# Patient Record
Sex: Male | Born: 1940 | Race: White | Hispanic: No | Marital: Single | Smoking: Former smoker
Health system: Southern US, Community
[De-identification: ages and names within clinical notes are randomized; demographics above are authoritative.]

## PROBLEM LIST (undated history)

## (undated) DIAGNOSIS — J449 Chronic obstructive pulmonary disease, unspecified: Secondary | ICD-10-CM

## (undated) DIAGNOSIS — I10 Essential (primary) hypertension: Secondary | ICD-10-CM

## (undated) DIAGNOSIS — M1990 Unspecified osteoarthritis, unspecified site: Secondary | ICD-10-CM

## (undated) DIAGNOSIS — I2510 Atherosclerotic heart disease of native coronary artery without angina pectoris: Secondary | ICD-10-CM

## (undated) DIAGNOSIS — E785 Hyperlipidemia, unspecified: Secondary | ICD-10-CM

## (undated) DIAGNOSIS — Z9989 Dependence on other enabling machines and devices: Secondary | ICD-10-CM

## (undated) DIAGNOSIS — G4733 Obstructive sleep apnea (adult) (pediatric): Secondary | ICD-10-CM

## (undated) HISTORY — DX: Chronic obstructive pulmonary disease, unspecified: J44.9

## (undated) HISTORY — DX: Obstructive sleep apnea (adult) (pediatric): G47.33

## (undated) HISTORY — DX: Essential (primary) hypertension: I10

## (undated) HISTORY — DX: Dependence on other enabling machines and devices: Z99.89

## (undated) HISTORY — DX: Atherosclerotic heart disease of native coronary artery without angina pectoris: I25.10

## (undated) HISTORY — DX: Hyperlipidemia, unspecified: E78.5

## (undated) HISTORY — DX: Unspecified osteoarthritis, unspecified site: M19.90

## (undated) HISTORY — PX: CHOLECYSTECTOMY: SHX55

---

## 1986-01-28 HISTORY — PX: CARDIAC CATHETERIZATION: SHX172

## 1994-01-28 HISTORY — PX: ANGIOPLASTY: SHX39

## 2004-10-31 ENCOUNTER — Ambulatory Visit: Payer: Self-pay | Admitting: Internal Medicine

## 2004-11-26 ENCOUNTER — Ambulatory Visit: Payer: Self-pay | Admitting: Internal Medicine

## 2004-12-04 ENCOUNTER — Encounter (INDEPENDENT_AMBULATORY_CARE_PROVIDER_SITE_OTHER): Payer: Self-pay | Admitting: *Deleted

## 2004-12-04 ENCOUNTER — Ambulatory Visit: Payer: Self-pay | Admitting: Internal Medicine

## 2004-12-27 ENCOUNTER — Encounter: Admission: RE | Admit: 2004-12-27 | Discharge: 2004-12-27 | Payer: Self-pay | Admitting: Orthopedic Surgery

## 2005-02-07 ENCOUNTER — Ambulatory Visit (HOSPITAL_COMMUNITY): Admission: RE | Admit: 2005-02-07 | Discharge: 2005-02-08 | Payer: Self-pay | Admitting: Orthopedic Surgery

## 2006-02-04 ENCOUNTER — Emergency Department (HOSPITAL_COMMUNITY): Admission: EM | Admit: 2006-02-04 | Discharge: 2006-02-05 | Payer: Self-pay | Admitting: Emergency Medicine

## 2007-09-14 ENCOUNTER — Ambulatory Visit (HOSPITAL_COMMUNITY): Admission: RE | Admit: 2007-09-14 | Discharge: 2007-09-14 | Payer: Self-pay | Admitting: Orthopedic Surgery

## 2007-12-09 ENCOUNTER — Ambulatory Visit: Payer: Self-pay | Admitting: Internal Medicine

## 2007-12-09 DIAGNOSIS — Z860100 Personal history of colon polyps, unspecified: Secondary | ICD-10-CM | POA: Insufficient documentation

## 2007-12-09 DIAGNOSIS — K219 Gastro-esophageal reflux disease without esophagitis: Secondary | ICD-10-CM | POA: Insufficient documentation

## 2007-12-09 DIAGNOSIS — R7309 Other abnormal glucose: Secondary | ICD-10-CM

## 2007-12-22 ENCOUNTER — Encounter: Payer: Self-pay | Admitting: Internal Medicine

## 2007-12-22 ENCOUNTER — Ambulatory Visit: Payer: Self-pay | Admitting: Internal Medicine

## 2008-04-05 ENCOUNTER — Ambulatory Visit (HOSPITAL_COMMUNITY): Admission: RE | Admit: 2008-04-05 | Discharge: 2008-04-05 | Payer: Self-pay | Admitting: Cardiovascular Disease

## 2009-04-20 ENCOUNTER — Telehealth (INDEPENDENT_AMBULATORY_CARE_PROVIDER_SITE_OTHER): Payer: Self-pay | Admitting: *Deleted

## 2009-12-26 ENCOUNTER — Encounter (INDEPENDENT_AMBULATORY_CARE_PROVIDER_SITE_OTHER): Payer: Self-pay | Admitting: *Deleted

## 2010-01-08 ENCOUNTER — Encounter (INDEPENDENT_AMBULATORY_CARE_PROVIDER_SITE_OTHER): Payer: Self-pay | Admitting: *Deleted

## 2010-01-09 ENCOUNTER — Telehealth: Payer: Self-pay | Admitting: Internal Medicine

## 2010-02-05 ENCOUNTER — Encounter (INDEPENDENT_AMBULATORY_CARE_PROVIDER_SITE_OTHER): Payer: Self-pay | Admitting: *Deleted

## 2010-02-06 ENCOUNTER — Ambulatory Visit
Admission: RE | Admit: 2010-02-06 | Discharge: 2010-02-06 | Payer: Self-pay | Source: Home / Self Care | Attending: Internal Medicine | Admitting: Internal Medicine

## 2010-02-27 ENCOUNTER — Ambulatory Visit
Admission: RE | Admit: 2010-02-27 | Discharge: 2010-02-27 | Payer: Self-pay | Source: Home / Self Care | Attending: Internal Medicine | Admitting: Internal Medicine

## 2010-02-27 ENCOUNTER — Encounter: Payer: Self-pay | Admitting: Internal Medicine

## 2010-02-27 LAB — GLUCOSE, CAPILLARY: Glucose-Capillary: 99 mg/dL (ref 70–99)

## 2010-02-27 NOTE — Letter (Signed)
Summary: Colonoscopy Letter  [facility]  [address] [place], [place] [address]   Phone: [phone]  Fax: [phone]      December 26, 2009 MRN: [id number]   Charles Mayer [address] [place], [place]  [address]   Dear Mr. Acri,   According to your medical record, it is time for you to schedule a Colonoscopy. The American Cancer Society recommends this procedure as a method to detect early colon cancer. Patients with a family history of colon cancer, or a personal history of colon polyps or inflammatory bowel disease are at increased risk.  This letter has been generated based on the recommendations made at the time of your procedure. If you feel that in your particular situation this may no longer apply, please contact our office.  Please call our office at [phone] to schedule this appointment or to update your records at your earliest convenience.  Thank you for cooperating with [place] to provide you with the very best care possible.   Sincerely,   Iva Boop, M.D.  [facility]astro[facility]ology [facility] [phone]

## 2010-02-27 NOTE — Progress Notes (Signed)
  Phone Note Other Incoming   Request: Send information Summary of Call: Request received from [employer] forwarded to [employer].

## 2010-03-01 NOTE — Miscellaneous (Signed)
Summary: [facility] PV  Clinical Lists Changes  Medications: Added new medication of MOVIPREP 100 GM  SOLR (PEG-KCL-NACL-NASULF-NA ASC-C) As per prep instructions. - Signed Rx of MOVIPREP 100 GM  SOLR (PEG-KCL-NACL-NASULF-NA ASC-C) As per prep instructions.;  #1 x 0;  Signed;  Entered by: Doristine Church RN II;  Authorized by: Iva Boop MD, [facility];  Method [facility]: [facility]  [phone]*, [address], [place], [place]  [address], Ph: [phone] or [phone], Fax: [phone] Allergies: Changed allergy or adverse reaction from ADHESIVE TAPE to ADHESIVE TAPE Observations: Added new observation of ALLERGY REV: Done (02/06/2010 8:09)    Prescriptions: MOVIPREP 100 GM  SOLR (PEG-KCL-NACL-NASULF-NA ASC-C) As per prep instructions.  #1 x 0   Entered by:   Doristine Church RN II   Authorized by:   Iva Boop MD, [facility]   Signed by:   Doristine Church RN II on 02/06/2010   Method used:   [facility]ronically to        [facility]  [phone]* (retail)       [address] [place], [place]  [address]       Ph: [phone] or [phone]       Fax: [phone]   RxID:   [id number]

## 2010-03-01 NOTE — Letter (Signed)
Summary: Moviprep Instructions  [facility]  [address]   [place], [place] [address]   Phone: [phone]  Fax: [phone]       Charles Mayer    15-Dec-1940    MRN: [id number]        Procedure Day Dorna Bloom: Tuesday, 02-27-10     Arrival Time: 7:30 a.m.      Procedure Time: 8:30 a.m.     Location of Procedure:                    x   [facility] (4th Floor)   PREPARATION FOR COLONOSCOPY WITH MOVIPREP   Starting 5 days prior to your procedure 02-22-10 do not eat nuts, seeds, popcorn, corn, beans, peas,  salads, or any raw vegetables.  Do not take any fiber supplements (e.g. Metamucil, Citrucel, and Benefiber).  THE DAY BEFORE YOUR PROCEDURE         DATE: 02-26-10  DAY: Monday  1.  Drink clear liquids the entire day-NO SOLID FOOD  2.  Do not drink anything colored red or purple.  Avoid juices with pulp.  No orange juice.  3.  Drink at least 64 oz. (8 glasses) of fluid/clear liquids during the day to prevent dehydration and help the prep work efficiently.  CLEAR LIQUIDS INCLUDE: Water Jello Ice Popsicles Tea (sugar ok, no milk/cream) Powdered fruit flavored drinks Coffee (sugar ok, no milk/cream) Gatorade Juice: apple, white grape, white cranberry  Lemonade Clear bullion, consomm, broth Carbonated beverages (any kind) Strained chicken noodle soup Hard Candy                             4.  In the morning, mix first dose of MoviPrep solution:    Empty 1 Pouch A and 1 Pouch B into the disposable container    Add lukewarm drinking water to the top line of the container. Mix to dissolve    Refrigerate (mixed solution should be used within 24 hrs)  5.  Begin drinking the prep at 5:00 p.m. The MoviPrep container is divided by 4 marks.   Every 15 minutes drink the solution down to the next mark (approximately 8 oz) until the full liter is complete.   6.  Follow completed prep with 16 oz of clear liquid of your choice (Nothing red or  purple).  Continue to drink clear liquids until bedtime.  7.  Before going to bed, mix second dose of MoviPrep solution:    Empty 1 Pouch A and 1 Pouch B into the disposable container    Add lukewarm drinking water to the top line of the container. Mix to dissolve    Refrigerate  THE DAY OF YOUR PROCEDURE      DATE: 02-27-10  DAY: Tuesday  Beginning at 3:30 a.m. (5 hours before procedure):         1. Every 15 minutes, drink the solution down to the next mark (approx 8 oz) until the full liter is complete.  2. Follow completed prep with 16 oz. of clear liquid of your choice.    3. You may drink clear liquids until 6:30 a.m. (2 HOURS BEFORE PROCEDURE).   MEDICATION INSTRUCTIONS      Additional medication instructions: _Do not take med with HCTZ the am of procedure         OTHER INSTRUCTIONS  You will need a responsible adult at least 69 years of age to accompany you  and drive you home.   This person must remain in the waiting room during your procedure.  Wear loose fitting clothing that is easily removed.  Leave jewelry and other valuables at home.  However, you may wish to bring a book to read or  an iPod/MP3 player to listen to music as you wait for your procedure to start.  Remove all body piercing jewelry and leave at home.  Total time from sign-in until discharge is approximately 2-3 hours.  You should go home directly after your procedure and rest.  You can resume normal activities the  day after your procedure.  The day of your procedure you should not:   Drive   Make legal decisions   Operate machinery   Drink alcohol   Return to work  You will receive specific instructions about eating, activities and medications before you leave.    The above instructions have been reviewed and explained to me by   Doristine Church RN II  February 06, 2010 8:46 AM    I fully understand and can verbalize these instructions _____________________________ Date  _________

## 2010-03-01 NOTE — Letter (Signed)
Summary: Pre Visit Letter Revised  [facility]  [address] [place], [place] [address]   Phone: [phone]  Fax: [phone]        01/08/2010 MRN: [id number] Charles Mayer [address] [place], [place]  [address]             Procedure Date:  02/27/2010  Welcome to the Gastroenterology Division at [facility].    You are scheduled to see a nurse for your pre-procedure visit on 02/06/2010 at 8:00AM on the 3rd floor at [facility], [address]  We ask that you try to arrive at our office 15 minutes prior to your appointment time to allow for check-in.  Please take a minute to review the attached form.  If you answer "Yes" to one or more of the questions on the first page, we ask that you call the person listed at your earliest opportunity.  If you answer "No" to all of the questions, please complete the rest of the form and bring it to your appointment.    Your nurse visit will consist of discussing your medical and surgical history, your immediate family medical history, and your medications.   If you are unable to list all of your medications on the form, please bring the medication bottles to your appointment and we will list them.  We will need to be aware of both prescribed and over the counter drugs.  We will need to know exact dosage information as well.    Please be prepared to read and sign documents such as consent forms, a financial agreement, and acknowledgement forms.  If necessary, and with your consent, a friend or relative is welcome to sit-in on the nurse visit with you.  Please bring your insurance card so that we may make a copy of it.  If your insurance requires a referral to see a specialist, please bring your referral form from your primary care physician.  No co-pay is required for this nurse visit.     If you cannot keep your appointment, please call [phone] to cancel or reschedule prior to your appointment date.  This  allows [place] the opportunity to schedule an appointment for another patient in need of care.    Thank you for choosing [facility] for your medical needs.  We appreciate the opportunity to care for you.  Please visit [place] at our website  to learn more about our practice.  Sincerely, The Gastroenterology Division

## 2010-03-01 NOTE — Progress Notes (Signed)
Summary: previsit letter ?'s  Phone Note Call from Patient Call back at Home Phone [phone]   Caller: Patient Call For: Dr. Leone Payor Reason for Call: Talk to Nurse Summary of Call: previsit letter ?'s Initial call taken by: Vallarie Mare,  January 09, 2010 2:25 PM  Follow-up for Phone Call        pt is diet controlled diabetic, he is not on blood thinners.  Pt will bring med bottles with him to previsit. Follow-up by: Francee Piccolo CMA Duncan Dull),  January 09, 2010 4:55 PM

## 2010-03-07 NOTE — Procedures (Signed)
Summary: Colonoscopy  Patient: [name] Note: All result statuses are Final unless otherwise noted.  Tests: (1) Colonoscopy (COL)   COL Colonoscopy           DONE     [facility]     [address]     [place], [place]  [address]           COLONOSCOPY PROCEDURE REPORT           PATIENT:  Charles Mayer, Charles Mayer  MR#:  [id number]     BIRTHDATE:  05/27/1940, 69 yrs. old  GENDER:  male     ENDOSCOPIST:  Iva Boop, MD, [facility]           PROCEDURE DATE:  02/27/2010     PROCEDURE:  Higher-risk screening colonoscopy G0105           ASA CLASS:  Class II     INDICATIONS:  surveillance and high-risk screening, history of     pre-cancerous (adenomatous) colon polyps 1 mm adenoma 2006, no     adenomas 2003, reported adenomatous polyp(s) earlier than that     MEDICATIONS:   Fentanyl 50 mcg IV, Versed 7 mg IV           DESCRIPTION OF PROCEDURE:   After the risks benefits and     alternatives of the procedure were thoroughly explained, informed     consent was obtained.  Digital rectal exam was performed and     revealed no abnormalities and normal prostate.   The LB CF-[id number]     [id number] endoscope was introduced through the anus and advanced to     the cecum, which was identified by both the appendix and ileocecal     valve, without limitations.  The quality of the prep was     excellent, using MoviPrep.  The instrument was then slowly     withdrawn as the colon was fully examined. Insertion: 2:20 minutes     Withdrawal: 11:47 minutes     <<PROCEDUREIMAGES>>           FINDINGS:  Moderate diverticulosis was found in the sigmoid colon.     This was otherwise a normal examination of the colon.     Retroflexed views in the rectum revealed no abnormalities.    The     scope was then withdrawn from the patient and the procedure     completed.           COMPLICATIONS:  None     ENDOSCOPIC IMPRESSION:     1) Moderate diverticulosis in the sigmoid colon     2) Otherwise normal  examination with excellent prep     3) Personal history adenomatous polyps (1mm adenoma 2006, others     years before 2003)           REPEAT EXAM:  In 7 year(s) for routine screening colonoscopy.           Iva Boop, MD, [facility]           CC:  Holley Bouche, MD     The Patient           n.     eSIGNED:   Iva Boop at 02/27/2010 09:04 AM           Salome Spotted, [id number]  Note: An exclamation mark (!) indicates a result that was not dispersed into the flowsheet. Document Creation Date: 02/27/2010 9:04 AM _______________________________________________________________________  (1) Order result status:  Final Collection or observation date-time: 02/27/2010 08:53 Requested date-time:  Receipt date-time:  Reported date-time:  Referring Physician:   Ordering Physician: Stan Head [phone]) Specimen Source:  Source: Launa Grill Order Number: [id number] Lab site:   Appended Document: Colonoscopy    Clinical Lists Changes  Observations: Added new observation of COLONNXTDUE: 01/2017 (02/27/2010 10:49)

## 2010-06-12 NOTE — Op Note (Signed)
NAME:  Charles Mayer, Charles Mayer NO.:  [id number]   MEDICAL RECORD NO.:  [id number]          PATIENT TYPE:  AMB   LOCATION:  DAY                          FACILITY:  [facility]   PHYSICIAN:  Marlowe Kays, M.D.  DATE OF BIRTH:  08-01-1940   DATE OF PROCEDURE:  09/14/2007  DATE OF DISCHARGE:                               OPERATIVE REPORT   PREOPERATIVE DIAGNOSES:  1. Torn medial meniscus.  2. Medial shelf plica, right knee.   POSTOPERATIVE DIAGNOSES:  1. Torn medial and lateral menisci.  2. Medial shelf and lateral sharp plicae.  3. Osteoarthritis, right knee   OPERATION:  Right knee arthroscopy with:  1. Partial medial and lateral meniscectomy.  2. Shaving of medial and lateral femoral condyles.  3. Shaving of patella.  4. Excision medial and lateral shelf plicae.   SURGEON:  Dr. Simonne Come.   ASSISTANT:  Nurse   ANESTHESIA:  General.   PATHOLOGY/JUSTIFICATION FOR PROCEDURE:  Painful right knee with the  diagnoses noted under the MRI.  He had additional pathology as discussed  below.   PROCEDURE:  He was cleared from his cardiologist for surgery.  Satisfactory general anesthesia, Ace wrap and knee support to left lower  extremity, right leg was Esmarch out nonsterilely and tourniquet  inflated to 350 mmHg thigh, stabilizer applied, right leg was prepped  with DuraPrep from stabilizer to ankle and draped in sterile field,  superior and medial saline inflow.  First, through an anterolateral  portal, medial compartment of the knee joint was evaluated.  In the  anterior third of the joint, there was a large amount of reactive  synovium, and after clearing this out with a 3.5 shaver, he had a  partial tear of the medial meniscus which I resected with the shaver.  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  He had a fairly extensive  tear on the undersurface of the posterior third of the medial  meniscus  which was not visible from the superior surface but was quite palpable  on probing.  I then resected the posterior third of the medial meniscus  back to a stable rim with a combination of small angle upbite baskets  and the 3.55 shaver.  Looking at the medial gutter and suprapatellar  area, he had good bit of wear of the patella which I shaved partially  with 3.5 shaver.  He also had a fairly prominent medial shelf plica with  also some prominent retinacular bands and the same laterally.  Part of  these bands and plica, I was able to section with scissors but had to  complete the resection with instruments in the lateral portal.  I then  reversed portals.  The ACL was intact.  He had a good bit of synovitis  laterally with grade 2-3/4 chondromalacia of the lateral femoral condyle  as well.  After cleaning this up with a 3.5 shaver, I was able to get a  good look the lateral meniscus with a small tear of the posterior curve  which I resected back to stable rim  with the shaver.  Pictures were  taken.  I then looked up the lateral gutter and suprapatellar area and  partially completed the sectioning of the plicae particularly medially  and did additional shaving of the patella.  I then reversed portals once  again and completed resection of the plicae and shaving of patella.  I  then irrigated the knee joint until clear and all fluid possible  removed.  I closed the 2 anterior portals with 4-0 nylon and injected 20  mL of half percent Marcaine with adrenaline through the inflow apparatus  which was removed, and this portal closed as well with 4-0 nylon.  Betadine, Adaptic, dry sterile pressure dressing were applied.  Tourniquet was released.  He tolerated the procedure well and was taken  to recovery in satisfactory with no known complications.           ______________________________  Marlowe Kays, M.D.     JA/MEDQ  D:  09/14/2007  T:  09/14/2007  Job:  [id number]

## 2010-06-15 NOTE — Op Note (Signed)
NAME:  Charles Mayer, Charles Mayer NO.:  [id number]   MEDICAL RECORD NO.:  [id number]          PATIENT TYPE:  AMB   LOCATION:  DAY                          FACILITY:  [facility]   PHYSICIAN:  Marlowe Kays, M.D.  DATE OF BIRTH:  05/14/1940   DATE OF PROCEDURE:  02/07/2005  DATE OF DISCHARGE:                                 OPERATIVE REPORT   PREOPERATIVE DIAGNOSIS:  1.  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  2.  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   POSTOPERATIVE DIAGNOSIS:  1.  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  2.  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   OPERATION:  Central decompressive laminectomy L3-4, L4-5.   SURGEON:  Marlowe Kays, M.D.   ASSISTANT:  Georges Lynch. Darrelyn Hillock, M.D.   ANESTHESIA:  General.   PATHOLOGY AND JUSTIFICATION FOR PROCEDURE:  He has had a long history of  back and left leg problems and in fact has a foot drop and numbness over the  dorsum of his left foot. He has recently developed some right thigh pain as  well. Workup has included an MRI which demonstrated a mass at L3-4. It was  of the most likely synovial cyst but could not be definitely determined and  a myelogram was recommended. This was performed showing almost a complete  block at L3-4 with what appeared to be a synovial cyst on the left and  foraminal stenosis at L4-5 bilaterally. The potential risk complications  were thoroughly discussed with him preoperatively.   DESCRIPTION OF PROCEDURE:  Prophylactic antibiotics, satisfactory general  anesthesia, prone position on the Andrews frame, back was prepped with  DuraPrep and with three spinal needles and a lateral x-ray, we tentatively  located the desired operative field. I then continued draping the back in a  sterile field, Ioban employed, a midline incision. We isolated what we  originally thought were the spinous processes of L3 and L4 but tagging  these  with Kocher clamps and taking lateral x-ray, the spinous processes were  actually 2 and 3. Evaluation was made a little more difficult because he had  a transitional vertebra of L5 with an incomplete interspace and also was a  large man making resolution of the x-ray a little less optimal. I extended  the incision distally and then we took a second x-ray with clamps on what we  felt was the spinous process of L3 and L4 with a Penfield 4 instrument in  the interspace of L4 and L5 and this was demonstrated to be the case on  another lateral x-ray. Accordingly, I removed the spinous process of L4, a  portion of the superior spinous process of L5 and a portion of L3. I then  began central and lateral decompression working carefully with patties and  combination 2 and 3 mm Kerrison rongeurs. He had fairly significant stenosis  noted at L3-4. We ultimately brought in the microscope and completed the  decompression cephalad, caudad and lateralward. At L3-4, he had a good bit  of ligamentum flavum which we thoroughly decompressed. There  was an  abnormality of the dural area at this level on the left side. We were really  unable to tell if this was an area where the dura had just been compressed  by the lateral recess or whether or not there was a residual synovial cyst  which was firmly adherent to the dura. At any rate, it did appear benign and  with the dura and the nerve roots thoroughly decompressed on the left, we  did not wish to have an iatrogenic dural injury. Accordingly when we  thoroughly decompressed centrally and the foramina at both levels to hockey  stock, the wound was then irrigated with sterile saline, Gelfoam was placed  over the area of the synovial cyst/dural injury and over the dura itself. I  then closed the wound somewhat loosely with interrupted #1 Vicryl in the  paralumbar muscle, the fascia I left open for a centimeter or so at either  end to allow egress of blood  though the wound did not appear to be  particularly bloody. The subcutaneous tissue was then closed in layers with  a combination of #1 and 2-0 Vicryl, staples in the skin. Betadine Adaptic  dry sterile dressing were applied. He tolerated the procedure well and was  gently moved on his operating room bed and at the time of this dictation was  on his way to the recovery room in satisfactory condition with no known  complications. Estimated blood loss was 350 mL. Patty count was correct.           ______________________________  Marlowe Kays, M.D.     JA/MEDQ  D:  02/07/2005  T:  02/08/2005  Job:  [id number]

## 2010-08-26 ENCOUNTER — Emergency Department (HOSPITAL_COMMUNITY): Payer: Medicare Other

## 2010-08-26 ENCOUNTER — Emergency Department (HOSPITAL_COMMUNITY)
Admission: EM | Admit: 2010-08-26 | Discharge: 2010-08-26 | Disposition: A | Discharge status: Home / Self Care | Payer: Medicare Other | Attending: Emergency Medicine | Admitting: Emergency Medicine

## 2010-08-26 DIAGNOSIS — I10 Essential (primary) hypertension: Secondary | ICD-10-CM | POA: Insufficient documentation

## 2010-08-26 DIAGNOSIS — E119 Type 2 diabetes mellitus without complications: Secondary | ICD-10-CM | POA: Insufficient documentation

## 2010-08-26 DIAGNOSIS — J4489 Other specified chronic obstructive pulmonary disease: Secondary | ICD-10-CM | POA: Insufficient documentation

## 2010-08-26 DIAGNOSIS — J449 Chronic obstructive pulmonary disease, unspecified: Secondary | ICD-10-CM | POA: Insufficient documentation

## 2010-08-26 DIAGNOSIS — R109 Unspecified abdominal pain: Secondary | ICD-10-CM | POA: Insufficient documentation

## 2010-08-26 DIAGNOSIS — N201 Calculus of ureter: Secondary | ICD-10-CM | POA: Insufficient documentation

## 2010-08-26 LAB — BASIC METABOLIC PANEL
BUN: 20 mg/dL (ref 6–23)
CO2: 26 mEq/L (ref 19–32)
Calcium: 9.3 mg/dL (ref 8.4–10.5)
Chloride: 101 mEq/L (ref 96–112)
Creatinine, Ser: 1.05 mg/dL (ref 0.50–1.35)
GFR calc Af Amer: >60 mL/min (ref >60)
GFR calc non Af Amer: >60 mL/min (ref >60)
Glucose, Bld: 161 mg/dL — ABNORMAL HIGH (ref 70–99)
Potassium: 3.8 mEq/L (ref 3.5–5.1)
Sodium: 138 mEq/L (ref 135–145)

## 2010-08-26 LAB — URINE MICROSCOPIC-ADD ON

## 2010-08-26 LAB — URINALYSIS, ROUTINE W REFLEX MICROSCOPIC
Glucose, UA: NEGATIVE mg/dL
Nitrite: NEGATIVE
Protein, ur: 30 mg/dL — AB
Specific Gravity, Urine: 1.026 (ref 1.005–1.030)
Urobilinogen, UA: 0.2 mg/dL (ref 0.0–1.0)
pH: 5.5 (ref 5.0–8.0)

## 2010-08-27 LAB — URINE CULTURE
Colony Count: NO GROWTH
Culture  Setup Time: 201207291501
Culture: NO GROWTH

## 2010-08-31 ENCOUNTER — Ambulatory Visit (HOSPITAL_BASED_OUTPATIENT_CLINIC_OR_DEPARTMENT_OTHER)
Admission: RE | Admit: 2010-08-31 | Discharge: 2010-08-31 | Disposition: A | Discharge status: Home / Self Care | Payer: Medicare Other | Source: Ambulatory Visit | Attending: Urology | Admitting: Urology

## 2010-08-31 DIAGNOSIS — G4733 Obstructive sleep apnea (adult) (pediatric): Secondary | ICD-10-CM | POA: Insufficient documentation

## 2010-08-31 DIAGNOSIS — Z01812 Encounter for preprocedural laboratory examination: Secondary | ICD-10-CM | POA: Insufficient documentation

## 2010-08-31 DIAGNOSIS — N201 Calculus of ureter: Secondary | ICD-10-CM | POA: Insufficient documentation

## 2010-08-31 DIAGNOSIS — Z01810 Encounter for preprocedural cardiovascular examination: Secondary | ICD-10-CM | POA: Insufficient documentation

## 2010-08-31 DIAGNOSIS — I252 Old myocardial infarction: Secondary | ICD-10-CM | POA: Insufficient documentation

## 2010-08-31 DIAGNOSIS — I10 Essential (primary) hypertension: Secondary | ICD-10-CM | POA: Insufficient documentation

## 2010-08-31 LAB — POCT I-STAT 4, (NA,K, GLUC, HGB,HCT)
Glucose, Bld: 121 mg/dL — ABNORMAL HIGH (ref 70–99)
Potassium: 4.1 mEq/L (ref 3.5–5.1)
Sodium: 134 mEq/L — ABNORMAL LOW (ref 135–145)

## 2010-09-14 NOTE — Op Note (Signed)
NAME:  Charles Mayer, Charles Mayer NO.:  [id number]  MEDICAL RECORD NO.:  [id number]  LOCATION:  WLED                         FACILITY:  [facility]  PHYSICIAN:  Danae Chen, M.D.  DATE OF BIRTH:  [birth year]  DATE OF PROCEDURE:  08/31/2010 DATE OF DISCHARGE:  08/26/2010                              OPERATIVE REPORT   PREOP DIAGNOSIS:  Left ureteral stone.  POSTOP DIAGNOSIS:  Left ureteral stone.  PROCEDURE:  Cystoscopy, left retrograde pyelogram, ureteroscopy, and insertion of double-J stent.  SURGEON:  Danae Chen, M.D..  ANESTHESIA:  General.  INDICATIONS:  The patient is a 70 year old male who was seen in the emergency room on July 29 with sudden onset of severe left flank pain. CT scan showed a 4-mm stone at the left UPJ.  The patient still complains of left flank pain and he has not passed a stone.  KUB done yesterday showed a large amount of gas in the colon and the stone was not seen.  But since the patient has continued to complain of pain, he is scheduled today for cystoscopy and stone manipulation.  The patient was identified by his wristband and proper time-out was taken.  Under general anesthesia, he was prepped and draped, and placed in the dorsal lithotomy position.  A panendoscope was inserted in the bladder. The anterior urethra is normal.  He has a moderate prostatic hypertrophy.  The bladder mucosa is normal.  There is no stone or tumor in the bladder.  The ureteral orifices are in normal position and shape.  Retrograde pyelogram.  A cone-tip catheter was passed through the cystoscope and through the left ureteral orifice.  It was difficult to catheterize the ureteral orifice with the cone-tip catheter.  The cone-tip catheter was then removed and a #6-French double-J stent was passed through the cystoscope and a Glidewire was passed through the open-ended catheter, and a Glidewire was passed through the left ureteral orifice and advanced up to the  renal pelvis.  The open-ended catheter was then passed over the Glidewire into the mid ureter. Contrast was then injected through the open-ended catheter.  There is no evidence of filling defect in the ureter and the renal pelvis and calyces appear normal.  A sensor wire was then passed through the open- ended catheter and the open-ended catheter was removed.  The intramural ureter was dilated with the inner sheath of the ureteroscope access sheath over the sensor wire.  The ureteroscope access sheath was then removed.  A semi-rigid ureteroscope was passed in the bladder and through the left ureteral orifice without difficulty and advanced up to the upper ureter.  There is no evidence of ureteral stone.  The ureteroscope was then removed.  The ureteroscope access sheath was then passed over the guidewire up to the proximal ureter and the digital flexible ureteroscope was passed through the ureteroscope access sheath after removing the sensor wire.  The ureteroscope was advanced all the way up to the kidney.  The calyces were identified and there was no evidence of stone in the calyces.  There is a moderate amount of blood clot in the middle calix.  Since no stone was identified, the ureteroscope was removed.  The  sensor wire was then passed through the ureteroscope access sheath and the ureteroscope access sheath was removed.  The sensor wire was then back loaded into the cystoscope and a #6-French - 26 double-J stent was passed over the guidewire then the guidewire was removed.  The proximal curl of the double-J stent is in the renal pelvis.  The distal curl is in the bladder.  The bladder was then emptied and the cystoscope was removed.  The patient tolerated the procedure well and left the OR in satisfactory condition to postanesthesia care unit.     Danae Chen, M.D.     MN/MEDQ  D:  08/31/2010  T:  09/01/2010  Job:  [id number]  Electronically Signed by Lindaann Slough M.D. on  09/14/2010 07:32:04 AM

## 2010-10-26 LAB — BASIC METABOLIC PANEL
BUN: 14
CO2: 29
Calcium: 9.4
Chloride: 105
Creatinine, Ser: 0.9
GFR calc Af Amer: >60
GFR calc non Af Amer: >60
Glucose, Bld: 120 — ABNORMAL HIGH
Potassium: 4.4
Sodium: 140

## 2010-10-26 LAB — HEMOGLOBIN AND HEMATOCRIT, BLOOD
HCT: 44.5
Hemoglobin: 14.9

## 2010-10-29 HISTORY — PX: CERVICAL SPINE SURGERY: SHX589

## 2010-10-31 ENCOUNTER — Encounter (HOSPITAL_COMMUNITY)
Admission: RE | Admit: 2010-10-31 | Discharge: 2010-10-31 | Disposition: A | Discharge status: Home / Self Care | Payer: Medicare Other | Source: Ambulatory Visit | Attending: Orthopedic Surgery | Admitting: Orthopedic Surgery

## 2010-10-31 ENCOUNTER — Other Ambulatory Visit (HOSPITAL_COMMUNITY): Payer: Self-pay | Admitting: Orthopedic Surgery

## 2010-10-31 DIAGNOSIS — M4712 Other spondylosis with myelopathy, cervical region: Secondary | ICD-10-CM

## 2010-10-31 LAB — BASIC METABOLIC PANEL
CO2: 27 mEq/L (ref 19–32)
Calcium: 9.3 mg/dL (ref 8.4–10.5)
Chloride: 102 mEq/L (ref 96–112)
GFR calc Af Amer: >90 mL/min (ref >90)
GFR calc non Af Amer: >90 mL/min (ref >90)
Glucose, Bld: 100 mg/dL — ABNORMAL HIGH (ref 70–99)
Sodium: 139 mEq/L (ref 135–145)

## 2010-10-31 LAB — CBC
Hemoglobin: 14.3 g/dL (ref 13.0–17.0)
MCV: 91.8 fL (ref 78.0–100.0)
Platelets: 226 10*3/uL (ref 150–400)
RBC: 4.53 MIL/uL (ref 4.22–5.81)
WBC: 6.7 10*3/uL (ref 4.0–10.5)

## 2010-11-08 ENCOUNTER — Inpatient Hospital Stay (HOSPITAL_COMMUNITY)
Admission: RE | Admit: 2010-11-08 | Discharge: 2010-11-10 | DRG: 473 | Disposition: A | Discharge status: Home / Self Care | Payer: Medicare Other | Source: Ambulatory Visit | Attending: Orthopedic Surgery | Admitting: Orthopedic Surgery

## 2010-11-08 ENCOUNTER — Inpatient Hospital Stay (HOSPITAL_COMMUNITY): Payer: Medicare Other

## 2010-11-08 DIAGNOSIS — Z860100 Personal history of colon polyps, unspecified: Secondary | ICD-10-CM

## 2010-11-08 DIAGNOSIS — M5030 Other cervical disc degeneration, unspecified cervical region: Principal | ICD-10-CM | POA: Diagnosis present

## 2010-11-08 DIAGNOSIS — E7800 Pure hypercholesterolemia, unspecified: Secondary | ICD-10-CM | POA: Diagnosis present

## 2010-11-08 DIAGNOSIS — E119 Type 2 diabetes mellitus without complications: Secondary | ICD-10-CM | POA: Diagnosis present

## 2010-11-08 DIAGNOSIS — K219 Gastro-esophageal reflux disease without esophagitis: Secondary | ICD-10-CM | POA: Diagnosis present

## 2010-11-08 DIAGNOSIS — I2510 Atherosclerotic heart disease of native coronary artery without angina pectoris: Secondary | ICD-10-CM | POA: Diagnosis present

## 2010-11-08 DIAGNOSIS — Z01812 Encounter for preprocedural laboratory examination: Secondary | ICD-10-CM

## 2010-11-08 DIAGNOSIS — J438 Other emphysema: Secondary | ICD-10-CM | POA: Diagnosis present

## 2010-11-08 DIAGNOSIS — M47812 Spondylosis without myelopathy or radiculopathy, cervical region: Secondary | ICD-10-CM | POA: Diagnosis present

## 2010-11-08 LAB — GLUCOSE, CAPILLARY: Glucose-Capillary: 109 mg/dL — ABNORMAL HIGH (ref 70–99)

## 2010-11-09 LAB — GLUCOSE, CAPILLARY: Glucose-Capillary: 118 mg/dL — ABNORMAL HIGH (ref 70–99)

## 2010-11-09 LAB — HEMOGLOBIN A1C
Hgb A1c MFr Bld: 6.2 % — ABNORMAL HIGH (ref <5.7)
Mean Plasma Glucose: 131 mg/dL — ABNORMAL HIGH (ref <117)

## 2010-11-10 LAB — GLUCOSE, CAPILLARY: Glucose-Capillary: 106 mg/dL — ABNORMAL HIGH (ref 70–99)

## 2010-11-13 NOTE — Op Note (Signed)
NAME:  Charles Mayer, Charles Mayer NO.:  [id number]  MEDICAL RECORD NO.:  [id number]  LOCATION:  5028                         FACILITY:  [facility]  PHYSICIAN:  Alvy Beal, MD    DATE OF BIRTH:  1940-09-01  DATE OF PROCEDURE:  11/08/2010 DATE OF DISCHARGE:                              OPERATIVE REPORT   PREOPERATIVE DIAGNOSIS:  Degenerative cervical spondylitic myeloradiculopathy.  POSTOPERATIVE DIAGNOSIS:  Degenerative cervical spondylitic myeloradiculopathy.  OPERATIVE PROCEDURE:  Anterior cervical diskectomy and fusion C3-C6.  COMPLICATIONS:  None.  CONDITION:  Stable.  EQUIPMENT USED:  Synthes anterior cervical Vector plate, 57 mm in length with 18-mm rescue screws locking into the C3 and C6 vertebral bodies, 16- mm screws into the bodies of C6, 14-mm screws into the body of C5.  8-mm medium Titan lordotic intervertebral graft cages packed with DBX mix at C3-4 and C4-5 with a 7-mm cage placed at C5-6.FIRST ASSISTANT:  Norval Gable, PA  HISTORY:  This is a very pleasant 70 year old gentleman who I have been treating for some time now.  He has had progressive difficulty ambulating, clumsiness in his hands, and his clinical exam was consistent with degenerative cervical myeloradiculopathy with right radicular arm pain.  Because of the progression in his imbalance and the ongoing cord signal changes, we elected to proceed with surgery.  All appropriate risks, benefits, and alternatives were discussed with the patient and consent was obtained.  OPERATIVE NOTE:  The patient was brought to the operating room, placed supine on the operating table.  After successful induction of general anesthesia and endotracheal intubation, TEDs, SCD, and a Foley were inserted.  Folded towels were placed between the shoulder blades. Shoulders were also taped down and the anterior cervical spine was prepped and draped in standard fashion for an anterior cervical approach.   Appropriate time-out was then done to confirm patient, procedure, and all other pertinent important data.  Once this was done, I performed a standard left lateral Smith-Robinson approach to the anterior cervical spine.  A longitudinal incision was made along the sternocleidomastoid and sharp dissection was carried out down to and through the platysma.  I then sharply dissected down along the medial border of the sternocleidomastoid until I could palpate the carotid artery.  I then swept the esophagus and trachea medially and protected it with a retractor.  This allowed me to easily identify the omohyoid muscle.  I sacrificed this muscle in order to gain better visualization. There was a small leash of veins which I also tied off and cut.  At this point, I could now visualize the anterior cervical spine.  I then swept the prevertebral fascia to completely expose the anterior longitudinal ligament from the midbody of C3 down to the midbody of C6.  I then placed a needle into the C3-4 disk space, took an x-ray to confirm that I was at the appropriate level.  Once this was done, I then used bipolar electrocautery to mobilize the longus colli muscles from the midbody of C3 to the midbody of C6.  Once this was done bilaterally, I could easily put a retractor blade underneath the longus colli muscle and expand the retractor.  While expanding the retractor,  I did deflate and reinflate the endotracheal cuff to reset it.  I then placed distraction pins into the bodies of C3 and C4, distracted the space.  Using a 15 blade scalpel, I performed an annulotomy, then using a combination of pituitary rongeurs, curettes, and Kerrison rongeurs, I removed all the disk material and overhanging osteophyte from the C3-4 level.  I then used a fine nerve curette to release the posterior longitudinal ligament and then used a nerve hook and 1-mm Kerrison to continue to develop a plane and remove the posterior  longitudinal ligament.  At this point, I could easily pass my nerve hook underneath the posterior vertebral body and I removed as much of the osteophyte from the posterior body of C3 and C4 as I felt I could safely.  At this point, I had a complete release posteriorly and decompression.  I rasped the endplates until I had bleeding subchondral bone, and I trialed with 7 and 8 mm lordotic medium cage and I felt the 8 gave the greatest and best fit.  I packed this with DBX mix and malleted it to the appropriate depth.  With this diskectomy complete, I then removed the distraction pin from C3, repositioned my retractors and placed distraction pins into the body of C5.  I distracted the C4-5 space and using the same technique, I performed a complete diskectomy.  Again, I took time to resect the posterior longitudinal ligament to ensure that I had an adequate and complete and thorough decompression.  Both endplates were rasped appropriately and the same size implant was placed.  I then repositioned again at C5-6 and again using the same technique, removed all the disk material and released the posterior longitudinal ligament. There was fragments of soft disk herniation noted at this level as well on the right side which were removed.  At this point, once I had all 3 diskectomy infusions complete, I contoured an anterior cervical plate and affixed it to the vertebral body at C3 initially with 16-mm rescue screws and 14- mm screws at C6.  X-rays demonstrated that while the screws had good purchase that I could still put longer screws.  Because of the long construct, I did want to get more secured fixation and so I removed the 14-mm screws that I had placed at C6 and placed 18-mm rescue screws.  They were still within the substance of the vertebral body, but I had excellent purchase.  I then did the same thing at C3 and again had excellent purchase.  I then placed the 16-mm rescue screws that  I removed from C3 into the body of C6 and 14-mm screws into the body of C4.  At this point, I had excellent fixation at all levels.  I then checked to ensure that the esophagus did not become inadvertently entrapped beneath the plate while inserted.  Both the trachea and esophagus were freed, and there was no evidence of any soft tissue entrapment beneath the plate.  I irrigated copiously with normal saline and obtained hemostasis using bipolar electrocautery.  I then returned the trachea and esophagus to midline.  Final x-rays were taken which were satisfactory.  I then closed the platysma with interrupted 2-0 Vicryl suture and a 3-0 Monocryl for the skin.  It is at this point, I had the anesthesiologist who released the cuff to ensure that he could breathe around the cuff.  The patient was able to breathe and move air around the deflated cuff indicating that there was  not a significant amount of swelling.  The total retraction time was just about 4 hours and there was minimal blood loss.  Given this, I felt confident that the patient could be extubated but I did plan keeping him on a continuous pulse ox with frequent respiratory checks to ensure there is no throat swelling.  The patient was transferred to the PACU with his Aspen collar without incident.  At the end of the case, all needle and sponge counts were correct.     Alvy Beal, MD     DDB/MEDQ  D:  11/08/2010  T:  11/08/2010  Job:  [id number]  Electronically Signed by Venita Lick MD on 11/13/2010 10:53:00 AM

## 2010-11-13 NOTE — Op Note (Signed)
  NAME:  Charles Mayer, Charles Mayer NO.:  [id number]  MEDICAL RECORD NO.:  [id number]  LOCATION:  5028                         FACILITY:  [facility]  PHYSICIAN:  Alvy Beal, MD    DATE OF BIRTH:  [birth year]  DATE OF PROCEDURE:  11/08/2010 DATE OF DISCHARGE:                              OPERATIVE REPORT   ADDENDUM:  The first assistant was Norval Gable.  She was instrumental in assisting with retraction, suction, removing disk material as well as the wound closure.     Alvy Beal, MD     DDB/MEDQ  D:  11/08/2010  T:  11/08/2010  Job:  [id number]  Electronically Signed by Venita Lick MD on 11/13/2010 10:53:02 AM

## 2010-12-04 NOTE — Discharge Summary (Signed)
NAME:  Charles Mayer, Charles Mayer NO.:  [id number]  MEDICAL RECORD NO.:  [id number]  LOCATION:  5028                         FACILITY:  [facility]  PHYSICIAN:  Alvy Beal, MD    DATE OF BIRTH:  10/27/1940  DATE OF ADMISSION:  11/08/2010 DATE OF DISCHARGE:  11/10/2010                              DISCHARGE SUMMARY   ADMITTING DIAGNOSES: 1. Degenerative cervical spondylitic myeloradiculopathy. 2. Chronic obstructive lung disease. 3. Coronary artery disease. 4. Type 2 diabetes mellitus. 5. Emphysema. 6. Gastroesophageal reflux disease. 7. High blood pressure. 8. Hypercholesterolemia. 9. Kidney stones. 10.Sleep apnea.  DISCHARGE DIAGNOSES: 1. Degenerative cervical spondylitic myeloradiculopathy, status post     anterior cervical diskectomy and fusion, C3 through C6. 2. Chronic obstructive lung disease. 3. Coronary artery disease. 4. Diabetes mellitus type 2. 5. Emphysema. 6. Gastroesophageal reflux disease. 7. High blood pressure. 8. Hypercholesterolemia. 9. Kidney stones. 10.Sleep apnea.  OPERATIVE PROCEDURE:  Anterior cervical diskectomy and fusion, C3 through C6.  SURGEON:  Alvy Beal, MD  ASSISTANT:  Norval Gable, PA  COMPLICATIONS:  None.  CONSULTS:  None.  BRIEF HISTORY:  Charles Mayer is a 70 year old gentleman who has been under the treatment of Dr. Venita Lick for quite some time.  He has had progressive difficulty ambulating, clumsiness in his hand, and his clinical exam was consistent with degenerative cervical myeloradiculopathy with right radicular arm pain.  Because of the progression of his imbalance and the ongoing cord signal changes, he elected to proceed with surgery.  All of the appropriate risks, benefits, and alternatives to surgery were discussed with the patient by Dr. Shon Baton.  Informed consent was obtained.  MRI of the cervical spine dated October 17, 2010 demonstrated: 1. C3-4 mild canal stenosis and cord deformity. 2.  C4-5 mild canal stenosis without cord deformity. 3. C5-6 mild canal stenosis and cord deformity with asymmetric mass     effect on the right C6 root. 4. C6-7 mild canal stenosis with dorsal cord deformity due to     ligamentum flavum thickening.  Disk bulging with endplate spur     affects the C7 root.  Moderate right foraminal stenosis.  HOSPITAL COURSE:  On November 08, 2010, the patient was admitted to [facility] and underwent the above stated procedure without complication.  The patient tolerated the procedure well.  In stable condition, he was transferred to the PACU and subsequently to the orthopedic floor.  On postop day 1, the patient reports doing fair.  He was in a moderate amount of pain, which included throat soreness.  There was no shortness of breath or chest pain.  He was experiencing flatus.  His Foley was removed at 6:30 in the morning.  He was alert and oriented x3 in no acute distress.  He was out of bed to the bathroom without difficulty.  On clinical exam, the abdomen was soft and nontender.  His neurovascular status in the bilateral upper extremities was intact.  His wounds remained clean, dry, and intact.  His compartments were soft and nontender.  The patient was stable.  We would continue care per the spinal protocol. He will be evaluated later that day by physical therapy and occupational therapy to assess  any acute needs.  On postop day 2, the patient was comfortable.  He was afebrile with stable vital signs.  His wound was clean, dry, and intact.  There was no erythema and no drainage.  Clinical exam was unchanged from the day before.  He was cleared by physical therapy and occupational therapy. He was tolerating a regular diet and his pain was well controlled with oral pain medications.  He was therefore discharged to home.  DISCHARGE MEDICATIONS: 1. Colace 100 mg 1 tab by mouth b.i.d. 2. Milk of magnesia 30 mL by mouth q.6 hours p.r.n.  constipation. 3. Percocet 10/325 one tablet by mouth q.4-6 hours. 4. Zofran 4 mg 1 tablet by mouth q.6 hours p.r.n. nausea. 5. _________ 1 tablet by mouth daily. 6. Calcium 500 mg 1 tablet by mouth daily. 7. Carisoprodol 350 mg 1 tablet by mouth q.6 hours p.r.n. 8. CoQ10 200 mg 1 tablet by mouth daily. 9. Chondroitin 1200 mg 1 tablet by mouth daily. 10.Glucosamine 1500 mg 1 tablet by mouth daily. 11.Lansoprazole 30 mg 1 capsule by mouth daily. 12.Metoprolol succinate 25 mg 1 tablet by mouth daily. 13.Pravastatin 10 mg 1 tablet by mouth daily. 14.Ramipril 10 mg 1 tablet by mouth daily. 15.Spiriva 18 mcg 1 capsule inhaled daily. 16.Triamterene and hydrochlorothiazide 37.5/25 mg 1 capsule by mouth     daily. 17.Vitamin B 1 tablet by mouth daily. 18.Vitamin C 500 mg 1 tablet by mouth daily. 19.Vitamin D3 1000 units 1 tablet by mouth daily.  DISCHARGE INSTRUCTIONS:  Activity:  Increase activity slowly.  Diet:  No restrictions.  Wound care:  Keep dry x1 week, and then shower.  Followup:  10-14 days with Dr. Shon Baton.  The patient to call to make an appointment.  The patient was provided with preprinted discharge instructions pertaining to his cervical fusion at the time of discharge.  All of his questions were encouraged, addressed, and answered.  The patient's condition at the time of discharge was considered stable.    ______________________________ Norval Gable, PA   ______________________________ Alvy Beal, MD    DK/MEDQ  D:  11/21/2010  T:  11/22/2010  Job:  [id number]  Electronically Signed by Norval Gable PA on 11/29/2010 02:56:46 PM Electronically Signed by Venita Lick MD on 12/04/2010 02:57:12 PM

## 2012-06-09 ENCOUNTER — Other Ambulatory Visit (HOSPITAL_COMMUNITY): Payer: Self-pay | Admitting: Cardiovascular Disease

## 2012-06-09 DIAGNOSIS — I25810 Atherosclerosis of coronary artery bypass graft(s) without angina pectoris: Secondary | ICD-10-CM

## 2012-06-09 DIAGNOSIS — I272 Pulmonary hypertension, unspecified: Secondary | ICD-10-CM

## 2012-06-09 DIAGNOSIS — I350 Nonrheumatic aortic (valve) stenosis: Secondary | ICD-10-CM

## 2012-07-08 ENCOUNTER — Ambulatory Visit (HOSPITAL_BASED_OUTPATIENT_CLINIC_OR_DEPARTMENT_OTHER)
Admission: RE | Admit: 2012-07-08 | Discharge: 2012-07-08 | Disposition: A | Discharge status: Home / Self Care | Payer: Medicare Other | Source: Ambulatory Visit | Attending: Cardiovascular Disease | Admitting: Cardiovascular Disease

## 2012-07-08 ENCOUNTER — Ambulatory Visit (HOSPITAL_COMMUNITY)
Admission: RE | Admit: 2012-07-08 | Discharge: 2012-07-08 | Disposition: A | Discharge status: Home / Self Care | Payer: Medicare Other | Source: Ambulatory Visit | Attending: Cardiovascular Disease | Admitting: Cardiovascular Disease

## 2012-07-08 DIAGNOSIS — I252 Old myocardial infarction: Secondary | ICD-10-CM | POA: Insufficient documentation

## 2012-07-08 DIAGNOSIS — I059 Rheumatic mitral valve disease, unspecified: Secondary | ICD-10-CM | POA: Insufficient documentation

## 2012-07-08 DIAGNOSIS — I25810 Atherosclerosis of coronary artery bypass graft(s) without angina pectoris: Secondary | ICD-10-CM

## 2012-07-08 DIAGNOSIS — I272 Pulmonary hypertension, unspecified: Secondary | ICD-10-CM

## 2012-07-08 DIAGNOSIS — I2510 Atherosclerotic heart disease of native coronary artery without angina pectoris: Secondary | ICD-10-CM

## 2012-07-08 DIAGNOSIS — E785 Hyperlipidemia, unspecified: Secondary | ICD-10-CM | POA: Insufficient documentation

## 2012-07-08 DIAGNOSIS — E119 Type 2 diabetes mellitus without complications: Secondary | ICD-10-CM | POA: Insufficient documentation

## 2012-07-08 DIAGNOSIS — I359 Nonrheumatic aortic valve disorder, unspecified: Secondary | ICD-10-CM | POA: Insufficient documentation

## 2012-07-08 DIAGNOSIS — I270 Primary pulmonary hypertension: Secondary | ICD-10-CM

## 2012-07-08 DIAGNOSIS — I2789 Other specified pulmonary heart diseases: Secondary | ICD-10-CM | POA: Insufficient documentation

## 2012-07-08 DIAGNOSIS — I10 Essential (primary) hypertension: Secondary | ICD-10-CM | POA: Insufficient documentation

## 2012-07-08 DIAGNOSIS — I350 Nonrheumatic aortic (valve) stenosis: Secondary | ICD-10-CM

## 2012-07-08 MED ORDER — TECHNETIUM TC 99M SESTAMIBI GENERIC - CARDIOLITE
10.8000 | Freq: Once | INTRAVENOUS | Status: AC | PRN
  Administered 2012-07-08: 11 via INTRAVENOUS

## 2012-07-08 MED ORDER — TECHNETIUM TC 99M SESTAMIBI GENERIC - CARDIOLITE
32.2000 | Freq: Once | INTRAVENOUS | Status: AC | PRN
  Administered 2012-07-08: 32.2 via INTRAVENOUS

## 2012-07-08 NOTE — Progress Notes (Signed)
[facility]   2D echo completed 07/08/2012.   Veda Canning, RDCS

## 2012-07-08 NOTE — Procedures (Addendum)
[facility] [address] [place] [address] [phone]  Cardiology Nuclear Med Study  Charles Mayer is a 72 y.o. male     MRN : [id number]     DOB: [birth year]  Procedure Date: 07/08/2012  Nuclear Med Background Indication for Stress Test:  Evaluation for Ischemia and Graft Patency History:  COPD and CAD;MI;PTCA-03/1994 Cardiac Risk Factors: Family History - CAD, History of Smoking, Hypertension, Lipids, NIDDM and Overweight  Symptoms:  Chest Pain, Dizziness, Fatigue and SOB   Nuclear Pre-Procedure Caffeine/Decaff Intake:  7:00pm NPO After: 5:00am   IV Site: R Hand  IV 0.9% NS with Angio Cath:  22g  Chest Size (in):  44"  IV Started by: [name], RN  Height: 6\' 1"  (1.854 m)  Cup Size: n/a  BMI:  Body mass index is 32.99 kg/(m^2). Weight:  250 lb (113.399 kg)   Tech Comments:  N/A    Nuclear Med Study 1 or 2 day study: 1 day  Stress Test Type:  Stress  Order Authorizing Provider:  [name], MD   Resting Radionuclide: Technetium 42m Sestamibi  Resting Radionuclide Dose: 10.8 mCi   Stress Radionuclide:  Technetium 71m Sestamibi  Stress Radionuclide Dose: 32.2 mCi           Stress Protocol Rest HR: 54 Stress HR: 146  Rest BP: 136/70 Stress BP: 211/89  Exercise Time (min): 8:00 METS: 10.1   Predicted Max HR: 150 bpm % Max HR: 97.33 bpm Rate Pressure Product: 46962  Dose of Adenosine (mg):  n/a Dose of Lexiscan:  NA  Dose of Atropine (mg): n/a Dose of Dobutamine: n/a mcg/kg/min (at max HR)  Stress Test Technologist: [name], CCT Nuclear Technologist: [name], CNMT   Rest Procedure:  Myocardial perfusion imaging was performed at rest 45 minutes following the intravenous administration of Technetium 48m Sestamibi. Stress Procedure:  The patient performed treadmill exercise using a Bruce  Protocol for 8:00 minutes. The patient stopped due to SOB and Fatigue and denied any chest pain.   There were no significant ST-T wave changes.  Technetium 19m Sestamibi was injected at peak exercise and myocardial perfusion imaging was performed after a brief delay.  Transient Ischemic Dilatation (Normal <1.22):  1.02 Lung/Heart Ratio (Normal <0.45):  0.33 QGS EDV:  143 ml QGS ESV:  76 ml LV Ejection Fraction: 47%  Signed by    Rest ECG: NSR - Normal EKG  Stress ECG: No significant change from baseline ECG  QPS Raw Data Images:  Normal; no motion artifact; normal heart/lung ratio. Stress Images:  There is decreased uptake in the inferior wall. Rest Images:  There is decreased uptake in the inferior wall. Subtraction (SDS):  No evidence of ischemia.  Impression Exercise Capacity:  Good exercise capacity. BP Response:  Normal blood pressure response. Clinical Symptoms:  No significant symptoms noted. ECG Impression:  No significant ST segment change suggestive of ischemia. Comparison with Prior Nuclear Study: No significant change from previous study  Overall Impression:  Low risk stress nuclear study with scar in the RCA territory..  LV Wall Motion:  Decreased wall motion in the inferior wall.   Runell Gess, MD  07/08/2012 2:44 PM

## 2012-07-09 ENCOUNTER — Encounter: Payer: Self-pay | Admitting: Cardiology

## 2012-07-09 DIAGNOSIS — I10 Essential (primary) hypertension: Secondary | ICD-10-CM | POA: Insufficient documentation

## 2012-07-09 DIAGNOSIS — J449 Chronic obstructive pulmonary disease, unspecified: Secondary | ICD-10-CM | POA: Insufficient documentation

## 2012-07-09 DIAGNOSIS — I2510 Atherosclerotic heart disease of native coronary artery without angina pectoris: Secondary | ICD-10-CM | POA: Insufficient documentation

## 2012-07-09 DIAGNOSIS — E785 Hyperlipidemia, unspecified: Secondary | ICD-10-CM | POA: Insufficient documentation

## 2012-07-09 DIAGNOSIS — G4733 Obstructive sleep apnea (adult) (pediatric): Secondary | ICD-10-CM | POA: Insufficient documentation

## 2012-07-09 DIAGNOSIS — M1990 Unspecified osteoarthritis, unspecified site: Secondary | ICD-10-CM | POA: Insufficient documentation

## 2012-07-09 DIAGNOSIS — Z9989 Dependence on other enabling machines and devices: Secondary | ICD-10-CM

## 2012-07-10 ENCOUNTER — Encounter: Payer: Self-pay | Admitting: Cardiovascular Disease

## 2012-07-13 ENCOUNTER — Encounter: Payer: Self-pay | Admitting: Cardiovascular Disease

## 2012-07-13 ENCOUNTER — Ambulatory Visit (INDEPENDENT_AMBULATORY_CARE_PROVIDER_SITE_OTHER): Payer: Medicare Other | Admitting: Cardiovascular Disease

## 2012-07-13 VITALS — BP 110/80 | HR 53 | Ht 75.0 in | Wt 257.2 lb

## 2012-07-13 DIAGNOSIS — J449 Chronic obstructive pulmonary disease, unspecified: Secondary | ICD-10-CM

## 2012-07-13 DIAGNOSIS — I10 Essential (primary) hypertension: Secondary | ICD-10-CM

## 2012-07-13 DIAGNOSIS — I2510 Atherosclerotic heart disease of native coronary artery without angina pectoris: Secondary | ICD-10-CM

## 2012-07-13 DIAGNOSIS — E669 Obesity, unspecified: Secondary | ICD-10-CM

## 2012-07-13 DIAGNOSIS — I359 Nonrheumatic aortic valve disorder, unspecified: Secondary | ICD-10-CM

## 2012-07-13 DIAGNOSIS — I350 Nonrheumatic aortic (valve) stenosis: Secondary | ICD-10-CM | POA: Insufficient documentation

## 2012-07-13 DIAGNOSIS — E785 Hyperlipidemia, unspecified: Secondary | ICD-10-CM

## 2012-07-13 DIAGNOSIS — G4733 Obstructive sleep apnea (adult) (pediatric): Secondary | ICD-10-CM

## 2012-07-13 NOTE — Progress Notes (Signed)
Patient ID: Charles Mayer, male   DOB: Sep 15, 1940, 72 y.o.   MRN: [id number]     HPI: Charles Mayer, is a 72 y.o. male who presents to the office today for a six-month cardiology evaluation.  Mr. Charles Mayer has known coronary artery disease dating back to 1988 when he was found to have total RCA occlusion with collaterals. In March 1996 he underwent PTCA of his LAD in [facility] [place]. In 2000 1118 nuclear perfusion study showed scar in the RCA territory without significant ischemia. An echo at that time showed mild concentric LVH with normal systolic function but grade 1 diastolic dysfunction. He did have aortic valve sclerosis, mild TR, and mild coronary hypertension.  Additional problems include COPD, obstructive sleep apnea for which he is on CPAP therapy, hyperlipidemia. He also has a history of hypertension and significant obesity with weights in the past being at 308 at its peak loss proximally 70 pounds. He now admits to a 20 pound weight gain particularly since his cervical neck surgery.  On 07/08/2012, the patient underwent a nuclear stress test. Post stress ejection fraction was 47%. There was evidence for inferior wall scar which is concordant with his known RCA occlusion. He did have hypocontractility inferiorly. He also underwent an echo Doppler study which shows normal systolic function. He did have grade 1 diastolic dysfunction. Now felt to have mild aortic stenosis with a mean gradient of 11 and a peak gradient of 24 mm. His left atrium is mild to moderately dilated. He did have mild pulmonary hypertension with an estimated RV systolic pressure at 33 mm. He presents now for followup evaluation.  Past Medical History  Diagnosis Date  . CAD (coronary artery disease) '88, '96    total RCA '88. LAD PCI '96. Low risk Nuc 10/11  . HTN (hypertension)   . OSA on CPAP   . Dyslipidemia   . COPD (chronic obstructive pulmonary disease)   . DJD (degenerative joint disease)     s/p  C-spine surgery 10/12    Past Surgical History  Procedure Laterality Date  . Cardiac catheterization  1988    Total RCA with collat  . Angioplasty  1996    LAD (in [place])  . Cervical spine surgery  Oct '12  . Cholecystectomy      Allergies  Allergen Reactions  . Penicillins     Current Outpatient Prescriptions  Medication Sig Dispense Refill  . B Complex-C (B-COMPLEX WITH VITAMIN C) tablet Take 1 tablet by mouth daily.      . carisoprodol (SOMA) 350 MG tablet Take 350 mg by mouth 4 (four) times daily as needed for muscle spasms.      . cholecalciferol (VITAMIN D) 1000 UNITS tablet Take 1,000 Units by mouth daily.      Marland Kitchen co-enzyme Q-10 30 MG capsule Take 100 mg by mouth 2 (two) times daily.      Marland Kitchen ezetimibe (ZETIA) 10 MG tablet Take 10 mg by mouth daily.      Marland Kitchen glucosamine-chondroitin 500-400 MG tablet Take 1 tablet by mouth 3 (three) times daily.      . lansoprazole (PREVACID) 30 MG capsule Take 30 mg by mouth daily.      . methocarbamol (ROBAXIN) 500 MG tablet Take 500 mg by mouth 3 (three) times daily. PRN      . metoprolol succinate (TOPROL-XL) 25 MG 24 hr tablet Take 25 mg by mouth daily.      . pravastatin (PRAVACHOL) 10 MG tablet Take 10  mg by mouth daily.      . ramipril (ALTACE) 10 MG tablet Take 10 mg by mouth daily.      Marland Kitchen tiotropium (SPIRIVA) 18 MCG inhalation capsule Place 18 mcg into inhaler and inhale daily.      Marland Kitchen triamterene-hydrochlorothiazide (DYAZIDE) 37.5-25 MG per capsule Take 1 capsule by mouth every morning.      . vitamin C (ASCORBIC ACID) 500 MG tablet Take 500 mg by mouth daily.      Marland Kitchen BOOSTRIX 5-2.5-18.5 injection        No current facility-administered medications for this visit.    Socially he is single. There is a 20 pound weight gain since November 2013. He admits to having reduced activity and not as compliant particularly with reference to sodium intake and other foods. He tries to stay active in the garden easily has not been consistently  exercising. There is no tobacco or alcohol use.  ROS is negative for fever chills or night sweats. He does admit to weight gain. He does have mild COPD but denies recent wheezing. He has sleep apnea and has been using his CPAP machine. He does note some dry mouth morning. He denies chest pressure. Sometimes after gardening he has noted some sharp fleeting chest discomfort which is short lived. He denies abdominal pain. He denies melena or hematochezia. He denies rash. He does note some mild leg swelling intermittently. He denies neurologic symptoms. Other system review is negative.  PE BP 110/80  Pulse 53  Ht 6\' 3"  (1.905 m)  Wt 257 lb 3.2 oz (116.665 kg)  BMI 32.15 kg/m2  General: Alert, oriented, no distress.  HEENT: Normocephalic, atraumatic. Pupils round and reactive; sclera anicteric;no lid lag,  Nose without nasal septal hypertrophy Mouth/Parynx benign; Mallinpatti scale 3 Neck: No JVD, no carotid briuts Lungs: clear to ausculatation and percussion; no wheezing or rales Heart: RRR, s1 s2 normal over 6 systolic murmur in the aortic region. Abdomen: soft, nontender; no hepatosplenomehaly, BS+; abdominal aorta nontender and not dilated by palpation. Pulses 2+ Extremities: Trace bilateral lower extremity edema; no clubbing cyanosis, Homan's sign negative  Neurologic: grossly nonfocal  ECG: Sinus bradycardia 53 beats per minute. PR interval 184 ms QTc interval 410 ms.  LABS:  BMET    Component Value Date/Time   NA 139 10/31/2010 0937   K 4.5 10/31/2010 0937   CL 102 10/31/2010 0937   CO2 27 10/31/2010 0937   GLUCOSE 100* 10/31/2010 0937   BUN 20 10/31/2010 0937   CREATININE 0.77 10/31/2010 0937   CALCIUM 9.3 10/31/2010 0937   GFRNONAA >90 10/31/2010 0937   GFRAA >90 10/31/2010 0937     Hepatic Function Panel  No results found for this basename: prot, albumin, ast, alt, alkphos, bilitot, bilidir, ibili     CBC    Component Value Date/Time   WBC 6.7 10/31/2010 0937   RBC 4.53  10/31/2010 0937   HGB 14.3 10/31/2010 0937   HCT 41.6 10/31/2010 0937   PLT 226 10/31/2010 0937   MCV 91.8 10/31/2010 0937   MCH 31.6 10/31/2010 0937   MCHC 34.4 10/31/2010 0937   RDW 13.6 10/31/2010 0937     BNP No results found for this basename: probnp    Lipid Panel  No results found for this basename: chol, trig, hdl, cholhdl, vldl, ldlcalc     RADIOLOGY: No results found.    ASSESSMENT AND PLAN: I had a long discussion with Charles Mayer. He has known RCA occlusion a recent nuclear  perfusion scan again shows inferior scar not significantly change from his study in 2011 although his ejection fraction on the stress test is now 47%, reduced from 53%. An echo Doppler study he did have normal systolic function at rest. He does have now mild aortic stenosis. We discussed his 20 pound weight gain and importance to reduce his sodium intake and increase his activity slightly. We also discussed his sleep apnea. I suggested he just humidification to reduce his tendency for a dry mouth upon awakening. His blood pressure is well controlled. He does have mild pulmonary hypertension. He will continue with his current medical regimen. I will see him in 6 months for cardiology followup evaluation.     Lennette Bihari, MD, [facility]  07/13/2012 10:19 AM

## 2012-07-13 NOTE — Patient Instructions (Signed)
Your physician recommends that you schedule a follow-up appointment in: 6 months.  No changes have been made today. 

## 2012-09-03 IMAGING — CT CT ABD-PELV W/O CM
2 of 4 series · 17 of 46 positions shown, 19 images · non-contrast
Comparison: None

CLINICAL DATA: left flank pain

CT ABDOMEN AND PELVIS WITHOUT CONTRAST
TECHNIQUE: Multidetector CT imaging of the abdomen and pelvis was
performed following the standard protocol without intravenous
contrast.

[Series 2: stone_wo 5.0 b40f st · axial · 0.81mm/px · z∈[-526,-86]mm · 14 of 96 slices shown, 16 images]
[im 4/96  soft-tissue]
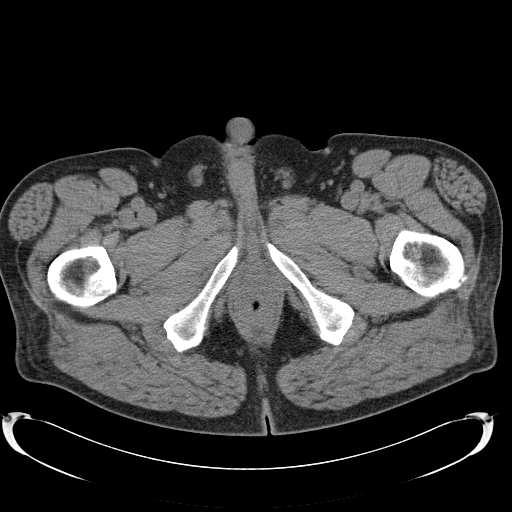
[im 4/96  bone]
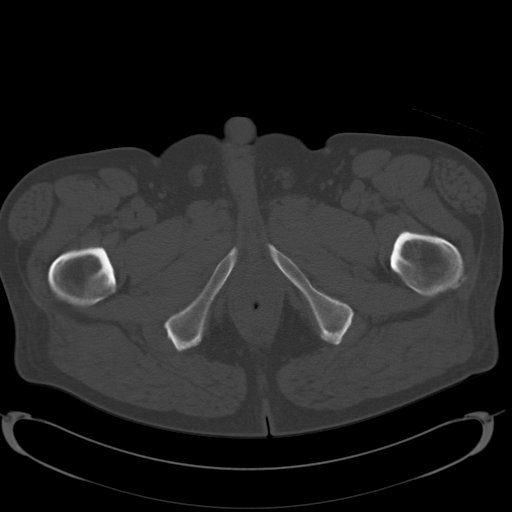
[im 11/96  soft-tissue]
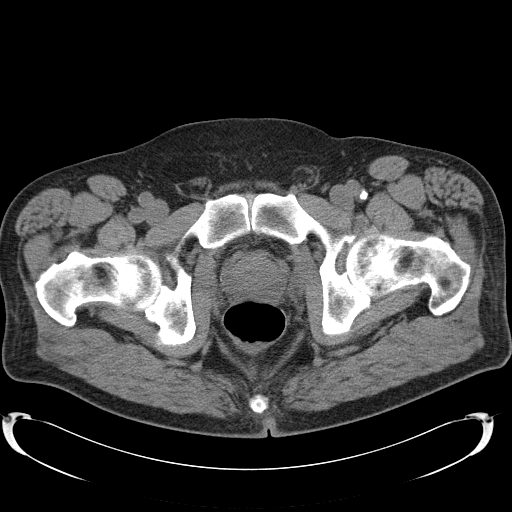
[im 19/96  soft-tissue]
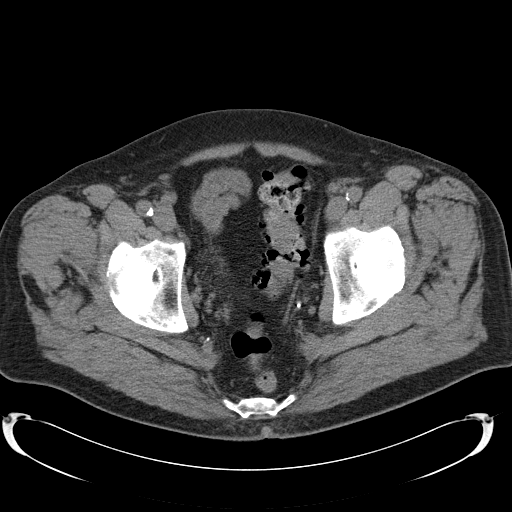
[im 26/96  soft-tissue]
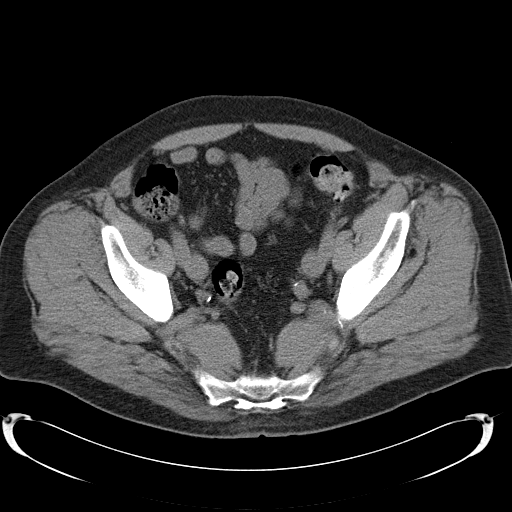
[im 33/96  soft-tissue]
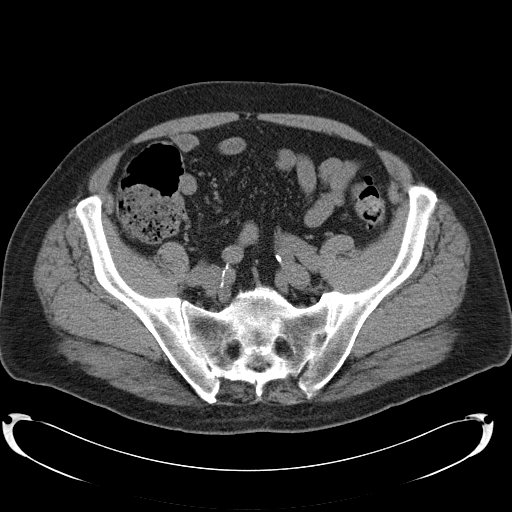
[im 37/96  soft-tissue]
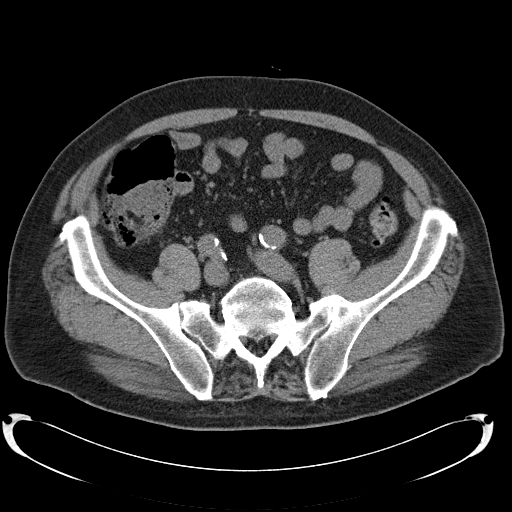
[im 44/96  soft-tissue]
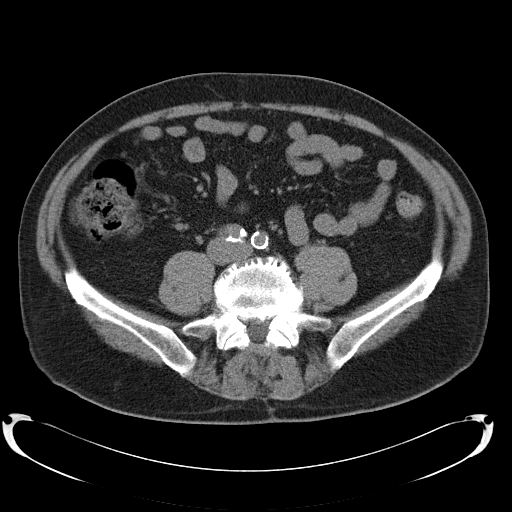
[im 52/96  soft-tissue]
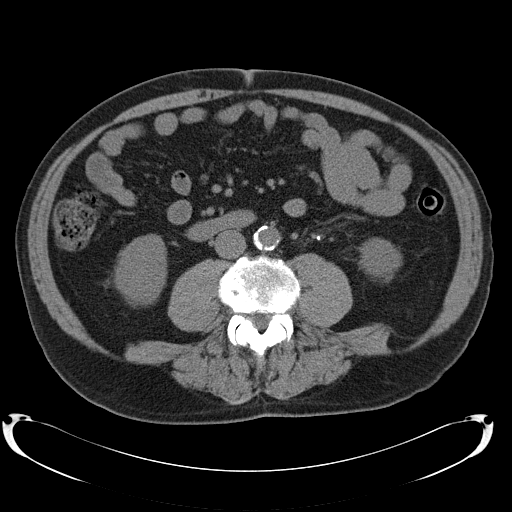
[im 59/96  soft-tissue]
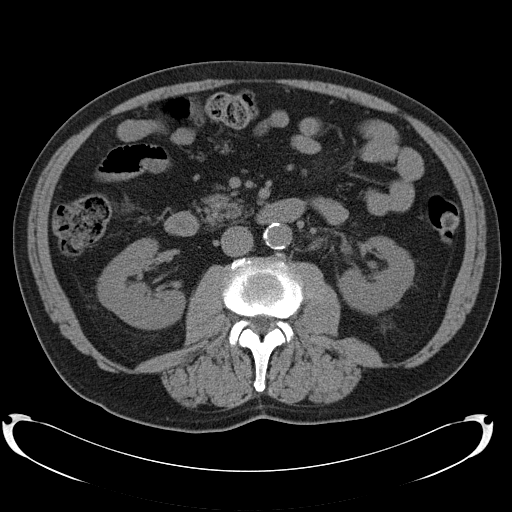
[im 59/96  bone]
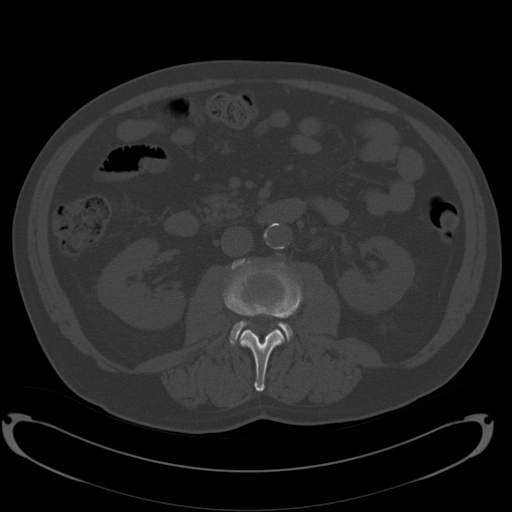
[im 63/96  soft-tissue]
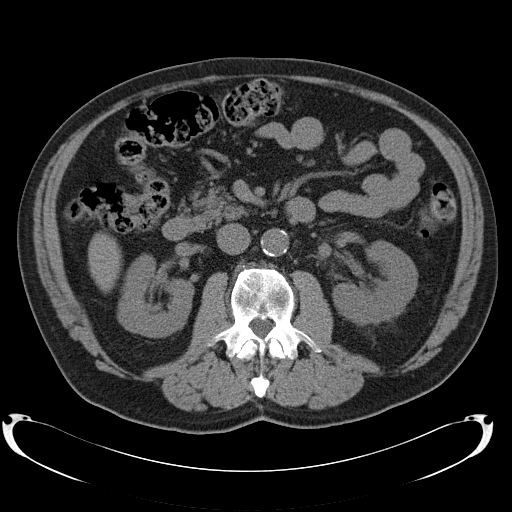
[im 70/96  soft-tissue]
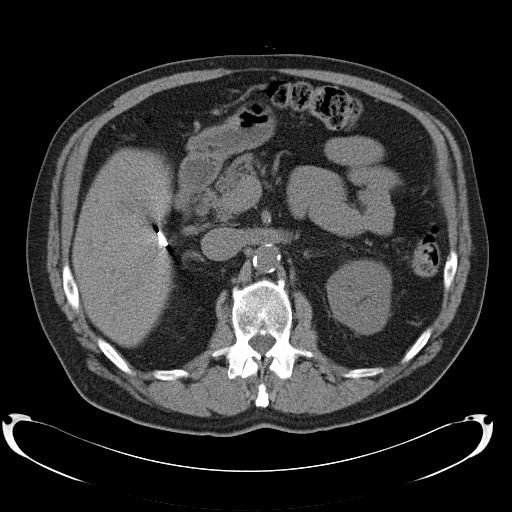
[im 77/96  soft-tissue]
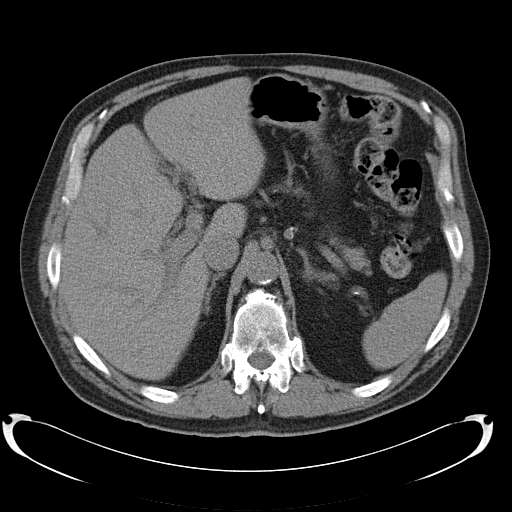
[im 85/96  soft-tissue]
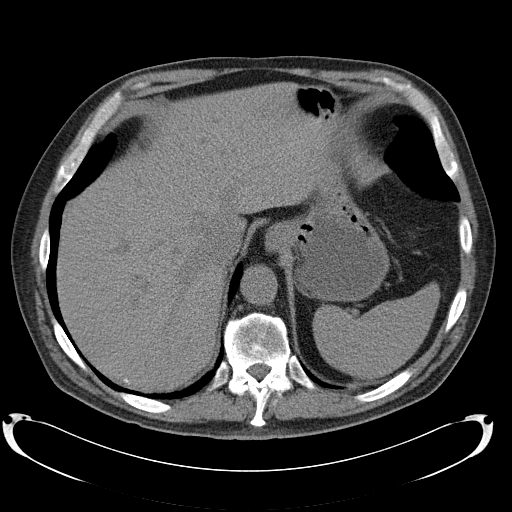
[im 92/96  soft-tissue]
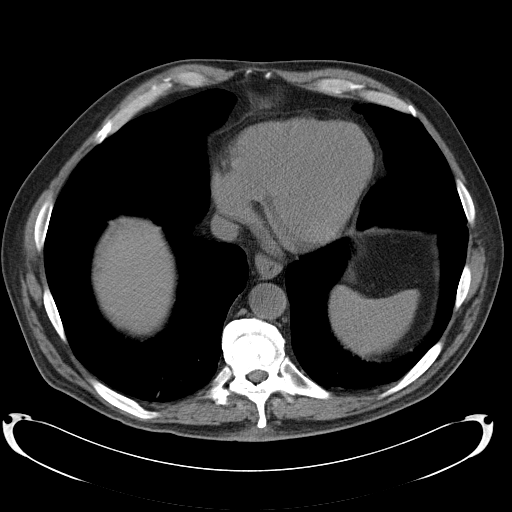

[Series 602: coronal abdomen · coronal · 0.97mm/px · 3 of 148 slices shown]
[im 50/148  soft-tissue]
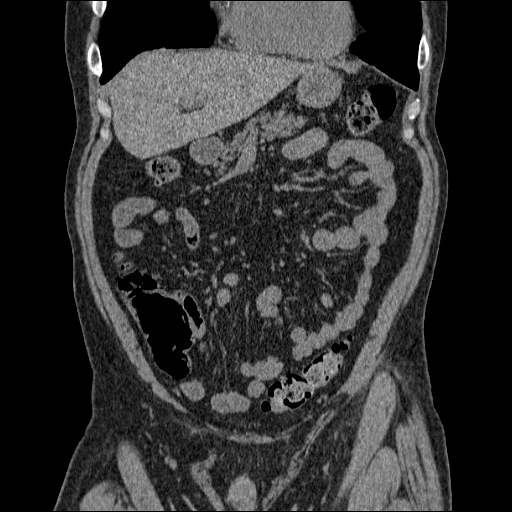
[im 66/148  soft-tissue]
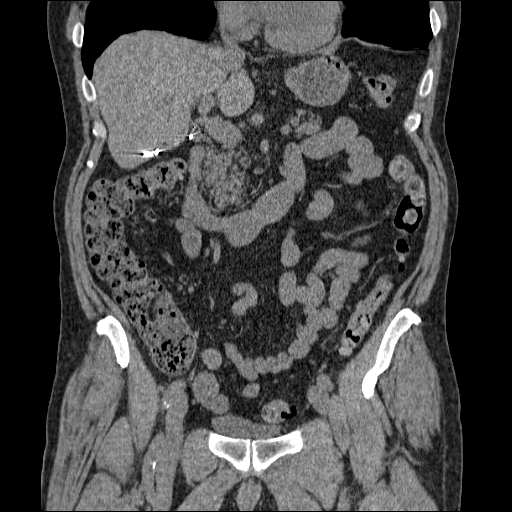
[im 82/148  soft-tissue]
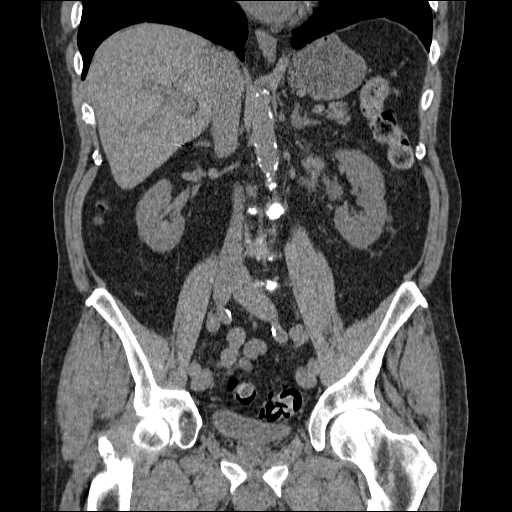

[17 of 46 positions shown; findings below may reference images not displayed]

FINDINGS: Scar versus plate-like atelectasis noted in the lung
bases.

No pericardial or pleural effusion.

The spleen is normal.

Both adrenal glands appear normal.

The liver parenchyma is negative.

Status post cholecystectomy.

No biliary ductal dilatation.

The pancreas appears within normal limits.

The right kidney appears normal.

There is mild left-sided hydronephrosis and perinephric fat
stranding.  At the left UPJ there is a stone which measures 4.1 mm,
image 45.

No distal ureteral calculi identified.

The urinary bladder appears normal.

No enlarged upper abdominal lymph nodes.

There is no pelvic or inguinal adenopathy.

Urinary bladder is normal.

The stomach and the small bowel loops are within normal limits.
Proximal colon is normal.  There is multiple sigmoid diverticula
without active inflammation.

There is no free fluid or fluid collections identified.

Review of the visualized osseous structures is significant for
lumbar degenerative disc disease.
IMPRESSION: 1.  Left UPJ stone with left-sided hydronephrosis.

## 2013-01-12 ENCOUNTER — Ambulatory Visit (INDEPENDENT_AMBULATORY_CARE_PROVIDER_SITE_OTHER): Payer: Medicare Other | Admitting: Cardiovascular Disease

## 2013-01-12 ENCOUNTER — Encounter: Payer: Self-pay | Admitting: Cardiovascular Disease

## 2013-01-12 VITALS — BP 160/80 | HR 59 | Ht 74.0 in | Wt 256.9 lb

## 2013-01-12 DIAGNOSIS — I359 Nonrheumatic aortic valve disorder, unspecified: Secondary | ICD-10-CM

## 2013-01-12 DIAGNOSIS — I10 Essential (primary) hypertension: Secondary | ICD-10-CM

## 2013-01-12 DIAGNOSIS — E669 Obesity, unspecified: Secondary | ICD-10-CM

## 2013-01-12 DIAGNOSIS — I2510 Atherosclerotic heart disease of native coronary artery without angina pectoris: Secondary | ICD-10-CM

## 2013-01-12 DIAGNOSIS — I350 Nonrheumatic aortic (valve) stenosis: Secondary | ICD-10-CM

## 2013-01-12 DIAGNOSIS — G4733 Obstructive sleep apnea (adult) (pediatric): Secondary | ICD-10-CM

## 2013-01-12 NOTE — Patient Instructions (Signed)
Your physician recommends that you schedule a follow-up appointment in: 6 MONTHS. No changes were made today in your therapy. 

## 2013-01-12 NOTE — Progress Notes (Signed)
Patient ID: Charles Mayer, male   DOB: 01-Feb-1940, 72 y.o.   MRN: [id number]      HPI: Charles Mayer, is a 72 y.o. male who presents to the office today for a six-month cardiology evaluation.  Charles Mayer has known coronary artery disease dating back to 1988 when he was found to have total RCA occlusion with collaterals. In March 1996 he underwent PTCA of his LAD in [facility] [place]. In 2000 1118 nuclear perfusion study showed scar in the RCA territory without significant ischemia. An echo at that time showed mild concentric LVH with normal systolic function but grade 1 diastolic dysfunction. He did have aortic valve sclerosis, mild TR, and mild coronary hypertension.  Additional problems include COPD, obstructive sleep apnea for which he is on CPAP therapy, hyperlipidemia. He also has a history of hypertension and significant obesity with weights in the past being at 308 at its peak loss proximally 70 pounds. He now admits to a 20 pound weight gain particularly since his cervical neck surgery.  On 07/08/2012, the patient underwent a nuclear stress test. Post stress ejection fraction was 47%. There was evidence for inferior wall scar which is concordant with his known RCA occlusion. He did have hypocontractility inferiorly. He also underwent an echo Doppler study which shows normal systolic function. He did have grade 1 diastolic dysfunction. Now felt to have mild aortic stenosis with a mean gradient of 11 and a peak gradient of 24 mm. His left atrium is mild to moderately dilated. He did have mild pulmonary hypertension with an estimated RV systolic pressure at 33 mm.   Over the past 6 months, Mr. particularly his has continued to do well. He walks almost daily. He does have arthritic issues involving his spine with significant amount of work fusion due to arthritis. He denies recurrent chest pain. He denies shortness of breath. He denies palpitations. He denies edema. He had seen Dr.  Tiburcio Pea and apparently recent laboratory was performed at [facility]. The patient states these were all normal. He presents for evaluation today.  Past Medical History  Diagnosis Date  . CAD (coronary artery disease) '88, '96    total RCA '88. LAD PCI '96. Low risk Nuc 10/11  . HTN (hypertension)   . OSA on CPAP   . Dyslipidemia   . COPD (chronic obstructive pulmonary disease)   . DJD (degenerative joint disease)     s/p C-spine surgery 10/12    Past Surgical History  Procedure Laterality Date  . Cardiac catheterization  1988    Total RCA with collat  . Angioplasty  1996    LAD (in [place])  . Cervical spine surgery  Oct '12  . Cholecystectomy      Allergies  Allergen Reactions  . Penicillins     Current Outpatient Prescriptions  Medication Sig Dispense Refill  . B Complex-C (B-COMPLEX WITH VITAMIN C) tablet Take 1 tablet by mouth daily.      Marland Kitchen BOOSTRIX 5-2.5-18.5 injection       . carisoprodol (SOMA) 350 MG tablet Take 350 mg by mouth 4 (four) times daily as needed for muscle spasms.      . cholecalciferol (VITAMIN D) 1000 UNITS tablet Take 1,000 Units by mouth daily.      Marland Kitchen co-enzyme Q-10 30 MG capsule Take 100 mg by mouth 2 (two) times daily.      Marland Kitchen ezetimibe (ZETIA) 10 MG tablet Take 10 mg by mouth daily.      Marland Kitchen glucosamine-chondroitin 500-400  MG tablet Take 1 tablet by mouth 3 (three) times daily.      . lansoprazole (PREVACID) 30 MG capsule Take 30 mg by mouth daily.      . methocarbamol (ROBAXIN) 500 MG tablet Take 500 mg by mouth 3 (three) times daily. PRN      . metoprolol succinate (TOPROL-XL) 25 MG 24 hr tablet Take 25 mg by mouth daily.      . NON FORMULARY CPAP      . pravastatin (PRAVACHOL) 10 MG tablet Take 10 mg by mouth daily.      . ramipril (ALTACE) 10 MG tablet Take 10 mg by mouth daily.      Marland Kitchen tiotropium (SPIRIVA) 18 MCG inhalation capsule Place 18 mcg into inhaler and inhale daily.      Marland Kitchen triamterene-hydrochlorothiazide (DYAZIDE) 37.5-25 MG per capsule Take 1  capsule by mouth every morning.      . vitamin C (ASCORBIC ACID) 500 MG tablet Take 500 mg by mouth daily.       No current facility-administered medications for this visit.    Socially he is single. There is a 20 pound weight gain since November 2013. He admits to having reduced activity and not as compliant particularly with reference to sodium intake and other foods. He tries to stay active in the garden easily has not been consistently exercising. There is no tobacco or alcohol use.  ROS is negative for fever chills or night sweats. He does admit to weight gain. He does have mild COPD but denies recent wheezing. He has sleep apnea and has been using his CPAP machine. He does note some dry mouth morning. He denies chest pressure. Sometimes after gardening he has noted some sharp fleeting chest discomfort which is short lived. He denies abdominal pain. He denies melena or hematochezia. He denies rash. He does note some mild leg swelling intermittently. He denies neurologic symptoms. Other system review is negative.  PE BP 160/80  Pulse 59  Ht 6\' 2"  (1.88 m)  Wt 256 lb 14.4 oz (116.529 kg)  BMI 32.97 kg/m2  Repeat blood pressure by me was 120/70. General: Alert, oriented, no distress.  HEENT: Normocephalic, atraumatic. Pupils round and reactive; sclera anicteric;no lid lag,  Nose without nasal septal hypertrophy Mouth/Parynx benign; Mallinpatti scale 3 Neck: No JVD, no carotid briuts Chest wall: No tenderness to palpation Lungs: clear to ausculatation and percussion; no wheezing or rales Heart: RRR, s1 s2 normal  1/ 6 systolic murmur in the aortic region. Abdomen: soft, nontender; no hepatosplenomehaly, BS+; abdominal aorta nontender and not dilated by palpation. Back: No CVA tenderness  Pulses 2+ Extremities: Trace bilateral lower extremity edema; no clubbing cyanosis, Homan's sign negative  Neurologic: grossly nonfocal Psychological: Normal affect and mood. Normal cognition  ECG:  Sinus bradycardia 59 beats per minute. PR interval 184 ms QTc interval 410 ms.  LABS:  BMET    Component Value Date/Time   NA 139 10/31/2010 0937   K 4.5 10/31/2010 0937   CL 102 10/31/2010 0937   CO2 27 10/31/2010 0937   GLUCOSE 100* 10/31/2010 0937   BUN 20 10/31/2010 0937   CREATININE 0.77 10/31/2010 0937   CALCIUM 9.3 10/31/2010 0937   GFRNONAA >90 10/31/2010 0937   GFRAA >90 10/31/2010 0937     Hepatic Function Panel  No results found for this basename: prot,  albumin,  ast,  alt,  alkphos,  bilitot,  bilidir,  ibili     CBC    Component Value Date/Time  WBC 6.7 10/31/2010 0937   RBC 4.53 10/31/2010 0937   HGB 14.3 10/31/2010 0937   HCT 41.6 10/31/2010 0937   PLT 226 10/31/2010 0937   MCV 91.8 10/31/2010 0937   MCH 31.6 10/31/2010 0937   MCHC 34.4 10/31/2010 0937   RDW 13.6 10/31/2010 0937     BNP No results found for this basename: probnp    Lipid Panel  No results found for this basename: chol,  trig,  hdl,  cholhdl,  vldl,  ldlcalc     RADIOLOGY: No results found.    ASSESSMENT AND PLAN: Mr. Delpizzo continues to do well from a cardiac perspective. He has known RCA occlusion a recent nuclear perfusion scan again shows inferior scar not significantly change from his study in 2011 although his ejection fraction on the stress test was 47%, reduced from 53%. An echo Doppler study revealed normal systolic function at rest. He does have  mild aortic stenosis. I did discuss symptoms associated with element of symptomatic aortic stenosis.  His weight has been stable since his last office visit.  He is tolerating his medical regimen well. He is on Zetia 10 mg and pravastatin 10 mg for hyperlipidemia. I will review the blood work when this becomes available from Dr. Johnathan Hausen office. Target LDL is less than 70. As long as he remains stable I will see him in 6 months for followup evaluation.    Lennette Bihari, MD, [facility]  01/12/2013 9:35 AM

## 2013-06-22 ENCOUNTER — Telehealth: Payer: Self-pay | Admitting: Cardiovascular Disease

## 2013-07-01 ENCOUNTER — Encounter: Payer: Self-pay | Admitting: Cardiovascular Disease

## 2013-07-01 ENCOUNTER — Ambulatory Visit (INDEPENDENT_AMBULATORY_CARE_PROVIDER_SITE_OTHER): Payer: Medicare Other | Admitting: Cardiovascular Disease

## 2013-07-01 VITALS — BP 112/72 | HR 59 | Ht 75.0 in | Wt 263.1 lb

## 2013-07-01 DIAGNOSIS — I10 Essential (primary) hypertension: Secondary | ICD-10-CM

## 2013-07-01 DIAGNOSIS — M1990 Unspecified osteoarthritis, unspecified site: Secondary | ICD-10-CM

## 2013-07-01 DIAGNOSIS — Z9989 Dependence on other enabling machines and devices: Secondary | ICD-10-CM

## 2013-07-01 DIAGNOSIS — I2510 Atherosclerotic heart disease of native coronary artery without angina pectoris: Secondary | ICD-10-CM

## 2013-07-01 DIAGNOSIS — G4733 Obstructive sleep apnea (adult) (pediatric): Secondary | ICD-10-CM

## 2013-07-01 DIAGNOSIS — E785 Hyperlipidemia, unspecified: Secondary | ICD-10-CM

## 2013-07-01 DIAGNOSIS — E669 Obesity, unspecified: Secondary | ICD-10-CM

## 2013-07-01 NOTE — Patient Instructions (Signed)
Your physician recommends that you schedule a follow-up appointment in: 1 year. No changes were made today in your therapy. 

## 2013-07-01 NOTE — Progress Notes (Signed)
Patient ID: Charles Mayer, male   DOB: 10/30/1940, 73 y.o.   MRN: [id number]       HPI: Charles Mayer, is a 73 y.o. male who presents to the office today for a seven-month cardiology evaluation.   Mr. Charles Mayer has known coronary artery disease dating back to 1988 when he was found to have total RCA occlusion with collaterals. In March 1996 he [facility] [place]. In 2000 1118 nuclear perfusion study showed scar in the RCA territory without significant ischemia. An echo at that time showed mild concentric LVH with normal systolic function but grade 1 diastolic dysfunction. He did have aortic valve sclerosis, mild TR, and mild coronary hypertension.  Additional problems include COPD, obstructive sleep apnea for which he is on CPAP therapy, hyperlipidemia. He also has a history of hypertension and significant obesity with weights in the past being at 308 at its peak loss proximally 70 pounds. He now admits to a 20 pound weight gain particularly since his cervical neck surgery.  On 07/08/2012, the patient underwent a nuclear stress test. Post stress ejection fraction was 47%. There was evidence for inferior wall scar which is concordant with his known RCA occlusion. He did have hypocontractility inferiorly. He also underwent an echo Doppler study which shows normal systolic function. He did have grade 1 diastolic dysfunction. Now felt to have mild aortic stenosis with a mean gradient of 11 and a peak gradient of 24 mm. His left atrium is mild to moderately dilated. He did have mild pulmonary hypertension with an estimated RV systolic pressure at 33 mm.   Over the past 7months, thereby, back complaints this has limited his exercise.  Previously he had walked  almost daily. He does have arthritic issues involving his spine. He denies recurrent chest pain. He denies shortness of breath. He denies palpitations. He denies edema.  He admits to poor sleeping.  At times.  He  does note significant nasal congestion.  He uses his CPAP with 100% compliance.  He does have a history of hyperlipidemia and has been on Zetia.  In addition to pravastatin 10 mg.  For blood pressure.  He has been on Dyazide as well as Altace 10 mg.  He presents for followup evaluation.  Past Medical History  Diagnosis Date  . CAD (coronary artery disease) '88, '96    total RCA '88. LAD PCI '96. Low risk Nuc 10/11  . HTN (hypertension)   . OSA on CPAP   . Dyslipidemia   . COPD (chronic obstructive pulmonary disease)   . DJD (degenerative joint disease)     s/p C-spine surgery 10/12    Past Surgical History  Procedure Laterality Date  . Cardiac catheterization  1988    Total RCA with collat  . Angioplasty  1996    [place]  . Cervical spine surgery  Oct '12  . Cholecystectomy      Allergies  Allergen Reactions  . Penicillins     Current Outpatient Prescriptions  Medication Sig Dispense Refill  . B Complex-C (B-COMPLEX WITH VITAMIN C) tablet Take 1 tablet by mouth daily.      Marland Kitchen. BOOSTRIX 5-2.5-18.5 injection       . carisoprodol (SOMA) 350 MG tablet Take 350 mg by mouth 4 (four) times daily as needed for muscle spasms.      . cholecalciferol (VITAMIN D) 1000 UNITS tablet Take 1,000 Units by mouth daily.      Marland Kitchen. co-enzyme Q-10  30 MG capsule Take 100 mg by mouth 2 (two) times daily.      Marland Kitchen ezetimibe (ZETIA) 10 MG tablet Take 10 mg by mouth daily.      Marland Kitchen glucosamine-chondroitin 500-400 MG tablet Take 1 tablet by mouth 3 (three) times daily.      . lansoprazole (PREVACID) 30 MG capsule Take 30 mg by mouth daily.      . methocarbamol (ROBAXIN) 500 MG tablet Take 500 mg by mouth 3 (three) times daily. PRN      . metoprolol succinate (TOPROL-XL) 25 MG 24 hr tablet Take 25 mg by mouth daily.      Marland Kitchen NITROSTAT 0.4 MG SL tablet Place 1 tablet under the tongue as needed.      . NON FORMULARY CPAP      . pravastatin (PRAVACHOL) 10 MG tablet Take 10 mg by mouth daily.      . ramipril  (ALTACE) 10 MG tablet Take 10 mg by mouth daily.      Marland Kitchen tiotropium (SPIRIVA) 18 MCG inhalation capsule Place 18 mcg into inhaler and inhale daily.      Marland Kitchen triamterene-hydrochlorothiazide (DYAZIDE) 37.5-25 MG per capsule Take 1 capsule by mouth every morning.      . vitamin C (ASCORBIC ACID) 500 MG tablet Take 500 mg by mouth daily.       No current facility-administered medications for this visit.    Socially he is single. There is a 20 pound weight gain since November 2013. He admits to having reduced activity and not as compliant particularly with reference to sodium intake and other foods. He tries to stay active in the garden easily has not been consistently exercising. There is no tobacco or alcohol use.  ROS General: Negative; No fevers, chills, or night sweats;  HEENT: Negative; No changes in vision or hearing, sinus congestion, difficulty swallowing Pulmonary: Negative; No cough, wheezing, shortness of breath, hemoptysis Cardiovascular: Negative; No chest pain, presyncope, syncope, palpatations He does note occasional leg swelling intermittently. GI: Negative; No nausea, vomiting, diarrhea, or abdominal pain GU: Negative; No dysuria, hematuria, or difficulty voiding Musculoskeletal: Positive for back discomfort; no myalgias,or weakness Hematologic/Oncology: Negative; no easy bruising, bleeding Endocrine: Negative; no heat/cold intolerance; no diabetes Neuro: Negative; no changes in balance, headaches Skin: Negative; No rashes or skin lesions Psychiatric: Negative; No behavioral problems, depression Sleep: Positive for sleep apnea on CPAP therapy No snoring, daytime sleepiness, hypersomnolence, bruxism, restless legs, hypnogognic hallucinations, no cataplexy Other comprehensive 14 point system review is negative.   PE BP 112/72  Pulse 59  Ht 6\' 3"  (1.905 m)  Wt 263 lb 1.6 oz (119.341 kg)  BMI 32.89 kg/m2  Repeat blood pressure by me was 120/70. General: Alert, oriented, no  distress.  HEENT: Normocephalic, atraumatic. Pupils round and reactive; sclera anicteric;no lid lag,  Nose without nasal septal hypertrophy Mouth/Parynx benign; Mallinpatti scale 3 Neck: No JVD, no carotid bruits with normal carotid upstroke Chest wall: No tenderness to palpation Lungs: clear to ausculatation and percussion; no wheezing or rales Heart: RRR, s1 s2 normal  1/ 6 systolic murmur in the aortic region.  No diastolic murmur.  No S3 or S4 gallop.  No rubs, thrills or heaves Abdomen: soft, nontender; no hepatosplenomehaly, BS+; abdominal aorta nontender and not dilated by palpation. Back: No CVA tenderness  Pulses 2+ Extremities: Trace bilateral lower extremity edema; no clubbing cyanosis, Homan's sign negative  Neurologic: grossly nonfocal Psychological: Normal affect and mood. Normal cognition  ECG (independently read by me) sinus  rhythm at 59 beats per minute.  Normal intervals.  No significant ST changes.  ECG: Sinus bradycardia 59 beats per minute. PR interval 184 ms QTc interval 410 ms.  LABS:  BMET    Component Value Date/Time   NA 139 10/31/2010 0937   K 4.5 10/31/2010 0937   CL 102 10/31/2010 0937   CO2 27 10/31/2010 0937   GLUCOSE 100* 10/31/2010 0937   BUN 20 10/31/2010 0937   CREATININE 0.77 10/31/2010 0937   CALCIUM 9.3 10/31/2010 0937   GFRNONAA >90 10/31/2010 0937   GFRAA >90 10/31/2010 0937     Hepatic Function Panel  No results found for this basename: prot,  albumin,  ast,  alt,  alkphos,  bilitot,  bilidir,  ibili     CBC    Component Value Date/Time   WBC 6.7 10/31/2010 0937   RBC 4.53 10/31/2010 0937   HGB 14.3 10/31/2010 0937   HCT 41.6 10/31/2010 0937   PLT 226 10/31/2010 0937   MCV 91.8 10/31/2010 0937   MCH 31.6 10/31/2010 0937   MCHC 34.4 10/31/2010 0937   RDW 13.6 10/31/2010 0937     BNP No results found for this basename: probnp    Lipid Panel  No results found for this basename: chol,  trig,  hdl,  cholhdl,  vldl,  ldlcalc      RADIOLOGY: No results found.    ASSESSMENT AND PLAN: Mr. Berkey is a 73 year old male with known CAD who continues to do well without recurrent anginal symptoms. He has known RCA occlusion  and his last nuclear perfusion scan  shows inferior scar not significantly change from his study in 2011 although his ejection fraction on the stress test was 47%, reduced from 53%. An echo Doppler study revealed normal systolic function at rest. He does have  mild aortic stenosis. I did discuss symptoms associated with element of symptomatic aortic stenosis.  He does have mild obesity.  He admits to mild weight gain.  He contributes this to his reduced activity level from previously. I did review his recent laboratory which was done at  at [facility]   He is tolerating his medical regimen well. He is on Zetia 10 mg and pravastatin 10 mg for hyperlipidemia.  Most recent cholesterol was 1:15, triglycerides 71, HDL 36, and LDL cholesterol 64, on therapy.  His blood pressure today is well controlled.  He is using CPAP with 100% compliance, but does note intermittent nasal congestion.  I have suggested nasal saline use before going to bed, as well as upon awakening.  In the past, he apparently did try a short course of Flonase.  He will continue his current medical regimen.  As long as he remains stable, I will see him in one year for cardiology reevaluation.   Lennette Bihari, MD, [facility]  07/01/2013 3:59 PM   /

## 2013-07-02 NOTE — Telephone Encounter (Signed)
Closed encounter °

## 2013-12-29 ENCOUNTER — Telehealth: Payer: Self-pay | Admitting: Cardiovascular Disease

## 2013-12-29 NOTE — Telephone Encounter (Signed)
Dr Tiburcio PeaHarris wanted Dr Tresa EndoKelly to know patient passed away yesterday-01/12/2014.Pt was found in his driveway.

## 2014-01-12 NOTE — Telephone Encounter (Signed)
I was not aware; thank you for letting me know.

## 2014-01-28 DEATH — deceased

## 2014-07-14 ENCOUNTER — Encounter: Payer: Self-pay | Admitting: Internal Medicine

## 2017-02-27 ENCOUNTER — Encounter: Payer: Self-pay | Admitting: Internal Medicine
